# Patient Record
Sex: Female | Born: 1955 | Race: White | Hispanic: No | Marital: Married | Smoking: Former smoker
Health system: Southern US, Community
[De-identification: ages and names within clinical notes are randomized; demographics above are authoritative.]

## PROBLEM LIST (undated history)

## (undated) DIAGNOSIS — I10 Essential (primary) hypertension: Secondary | ICD-10-CM

## (undated) DIAGNOSIS — K5790 Diverticulosis of intestine, part unspecified, without perforation or abscess without bleeding: Secondary | ICD-10-CM

## (undated) DIAGNOSIS — E785 Hyperlipidemia, unspecified: Secondary | ICD-10-CM

## (undated) HISTORY — DX: Diverticulosis of intestine, part unspecified, without perforation or abscess without bleeding: K57.90

## (undated) HISTORY — DX: Hyperlipidemia, unspecified: E78.5

## (undated) HISTORY — PX: TUBAL LIGATION: SHX77

## (undated) HISTORY — DX: Essential (primary) hypertension: I10

---

## 1998-11-18 ENCOUNTER — Other Ambulatory Visit: Admission: RE | Admit: 1998-11-18 | Discharge: 1998-11-18 | Payer: Self-pay | Admitting: *Deleted

## 2000-03-04 ENCOUNTER — Other Ambulatory Visit: Admission: RE | Admit: 2000-03-04 | Discharge: 2000-03-04 | Payer: Self-pay | Admitting: *Deleted

## 2002-03-06 ENCOUNTER — Other Ambulatory Visit: Admission: RE | Admit: 2002-03-06 | Discharge: 2002-03-06 | Payer: Self-pay | Admitting: Obstetrics & Gynecology

## 2003-04-02 ENCOUNTER — Other Ambulatory Visit: Admission: RE | Admit: 2003-04-02 | Discharge: 2003-04-02 | Payer: Self-pay | Admitting: Obstetrics & Gynecology

## 2010-09-10 HISTORY — PX: ENDOMETRIAL ABLATION: SHX621

## 2011-02-03 ENCOUNTER — Encounter: Payer: Self-pay | Admitting: Internal Medicine

## 2011-02-16 ENCOUNTER — Encounter: Payer: Self-pay | Admitting: Internal Medicine

## 2011-02-16 ENCOUNTER — Ambulatory Visit (AMBULATORY_SURGERY_CENTER): Payer: PRIVATE HEALTH INSURANCE | Admitting: *Deleted

## 2011-02-16 VITALS — Ht 66.0 in | Wt 167.5 lb

## 2011-02-16 DIAGNOSIS — Z1211 Encounter for screening for malignant neoplasm of colon: Secondary | ICD-10-CM

## 2011-02-16 MED ORDER — PEG-KCL-NACL-NASULF-NA ASC-C 100 G PO SOLR: ORAL | Status: DC

## 2011-03-02 ENCOUNTER — Ambulatory Visit (AMBULATORY_SURGERY_CENTER): Payer: PRIVATE HEALTH INSURANCE | Admitting: Internal Medicine

## 2011-03-02 ENCOUNTER — Encounter: Payer: Self-pay | Admitting: Internal Medicine

## 2011-03-02 VITALS — BP 138/80 | HR 80 | Temp 97.7°F | Resp 12 | Ht 66.5 in | Wt 165.0 lb

## 2011-03-02 DIAGNOSIS — Z1211 Encounter for screening for malignant neoplasm of colon: Secondary | ICD-10-CM

## 2011-03-02 DIAGNOSIS — K5730 Diverticulosis of large intestine without perforation or abscess without bleeding: Secondary | ICD-10-CM

## 2011-03-02 DIAGNOSIS — K649 Unspecified hemorrhoids: Secondary | ICD-10-CM

## 2011-03-02 HISTORY — PX: COLONOSCOPY: SHX174

## 2011-03-02 MED ORDER — SODIUM CHLORIDE 0.9 % IV SOLN: 500.0000 mL | INTRAVENOUS | Status: DC

## 2011-03-02 NOTE — Patient Instructions (Addendum)
No polyps were found on today's exam. You have diverticulosis and hemorrhoids, which are common findings that usually do not lead to any problems. Please read the information below and the discharge instructions provided by the nurse. Your next routine colon cancer screening with colonoscopy should be in about 10 years, 2022. Iva Boop, MD, [facility]  Hemorrhoids Hemorrhoids are dilated (enlarged) veins around the rectum. Sometimes clots will form in the veins. This makes them swollen and painful. These are called thrombosed hemorrhoids. Causes of hemorrhoids include:  Pregnancy: this increases the pressure in the hemorrhoidal veins.   Constipation.   Straining to have a bowel movement.  HOME CARE INSTRUCTIONS  Eat a well balanced diet and drink 6 to 8 glasses of water every day to avoid constipation. You may also use a bulk laxative.   Avoid straining to have bowel movements.   Keep anal area dry and clean.   Only take over-the-counter or prescription medicines for pain, discomfort, or fever as directed by your caregiver.  If thrombosed:  Take hot sitz baths for 20 to 30 minutes, 3 to 4 times per day.   If the hemorrhoids are very tender and swollen, place ice packs on area as tolerated. Using ice packs between sitz baths may be helpful. Fill a plastic bag with ice and use a towel between the bag of ice and your skin.   Special creams and suppositories (Anusol, Nupercainal, Wyanoids) may be used or applied as directed.   Do not use a donut shaped pillow or sit on the toilet for long periods. This increases blood pooling and pain.   Move your bowels when your body has the urge; this will require less straining and will decrease pain and pressure.   Only take over-the-counter or prescription medicines for pain, discomfort, or fever as directed by your caregiver.  SEEK MEDICAL CARE IF:  You have increasing pain and swelling that is not controlled with your prescription.   You  have uncontrolled bleeding.   You have an inability or difficulty having a bowel movement.   You have pain or inflammation outside the area of the hemorrhoids.   You have chills and/or an oral temperature above 100.80F that lasts for 2 days or longer, or as your caregiver suggests.  MAKE SURE YOU:   Understand these instructions.   Will watch your condition.   Will get help right away if you are not doing well or get worse.  Document Released: 07/24/2000 Document Re-Released: 07/09/2008 [facility] Patient Information 2011 [place], [place].  Diverticulosis Diverticulosis is a common condition that develops when small pouches (diverticula) form in the wall of the colon. The risk of diverticulosis increases with age. It happens more often in people who eat a low-fiber diet. Most individuals with diverticulosis have no symptoms. Those individuals with symptoms usually experience belly (abdominal) pain, constipation, or loose stools (diarrhea). HOME CARE INSTRUCTIONS  Increase the amount of fiber in your diet as directed by your caregiver or dietician. This may reduce symptoms of diverticulosis.   Your caregiver may recommend taking a dietary fiber supplement.   Drink at least 6 to 8 glasses of water or other fluids each day to prevent constipation.   Try not to strain when you have a bowel movement.   Your caregiver may recommend avoiding nuts and seeds to prevent complications, although this is still an uncertain benefit.   Only take over-the-counter or prescription medicines for pain, discomfort, or fever as directed by your caregiver.  FOODS  HAVING HIGH FIBER CONTENT INCLUDE:  Fruits. Apple, peach, pear, tangerine, raisins, prunes.   Vegetables. Brussels sprouts, asparagus, broccoli, cabbage, carrot, cauliflower, romaine lettuce, spinach, summer squash, tomato, winter squash, zucchini.   Starchy Vegetables. Baked beans, kidney beans, lima beans, split peas, lentils, potatoes (with  skin).   Grains. Whole wheat bread, brown rice, bran flake cereal, plain oatmeal, white rice, shredded wheat, bran muffins.  SEEK IMMEDIATE MEDICAL CARE IF:  You develop increasing pain or severe bloating.   You have an oral temperature above 100.23F, not controlled by medicine.   You develop vomiting or bowel movements that are bloody or black.  Document Released: 04/23/2004 Document Re-Released: 01/14/2010 [facility] Patient Information 2011 [place], [place].   Please review all discharge papers given to you by the recovery room nurse.  If you have any problems after discharge please call [phone]. We will call you in the am to see how you are doing and answer any questions you may have. Thank you.

## 2011-03-03 ENCOUNTER — Telehealth: Payer: Self-pay | Admitting: *Deleted

## 2011-03-03 NOTE — Telephone Encounter (Signed)
No answer, no i.d. On machine

## 2015-07-06 ENCOUNTER — Emergency Department (HOSPITAL_COMMUNITY): Payer: No Typology Code available for payment source

## 2015-07-06 ENCOUNTER — Emergency Department (HOSPITAL_COMMUNITY)
Admission: EM | Admit: 2015-07-06 | Discharge: 2015-07-06 | Disposition: A | Discharge status: Home / Self Care | Payer: No Typology Code available for payment source | Attending: Emergency Medicine | Admitting: Emergency Medicine

## 2015-07-06 ENCOUNTER — Encounter (HOSPITAL_COMMUNITY): Payer: Self-pay | Admitting: *Deleted

## 2015-07-06 DIAGNOSIS — S61411A Laceration without foreign body of right hand, initial encounter: Secondary | ICD-10-CM | POA: Diagnosis not present

## 2015-07-06 DIAGNOSIS — S61216A Laceration without foreign body of right little finger without damage to nail, initial encounter: Secondary | ICD-10-CM | POA: Insufficient documentation

## 2015-07-06 DIAGNOSIS — W268XXA Contact with other sharp object(s), not elsewhere classified, initial encounter: Secondary | ICD-10-CM | POA: Insufficient documentation

## 2015-07-06 DIAGNOSIS — Z23 Encounter for immunization: Secondary | ICD-10-CM | POA: Diagnosis not present

## 2015-07-06 DIAGNOSIS — Y9289 Other specified places as the place of occurrence of the external cause: Secondary | ICD-10-CM | POA: Diagnosis not present

## 2015-07-06 DIAGNOSIS — Z87891 Personal history of nicotine dependence: Secondary | ICD-10-CM | POA: Diagnosis not present

## 2015-07-06 DIAGNOSIS — Y9389 Activity, other specified: Secondary | ICD-10-CM | POA: Insufficient documentation

## 2015-07-06 DIAGNOSIS — S6991XA Unspecified injury of right wrist, hand and finger(s), initial encounter: Secondary | ICD-10-CM | POA: Diagnosis present

## 2015-07-06 DIAGNOSIS — Y998 Other external cause status: Secondary | ICD-10-CM | POA: Insufficient documentation

## 2015-07-06 MED ORDER — TETANUS-DIPHTH-ACELL PERTUSSIS 5-2.5-18.5 LF-MCG/0.5 IM SUSP
0.5000 mL | Freq: Once | INTRAMUSCULAR | Status: AC
  Administered 2015-07-06: 0.5 mL via INTRAMUSCULAR
  Filled 2015-07-06: qty 0.5

## 2015-07-06 MED ORDER — HYDROCODONE-ACETAMINOPHEN 5-325 MG PO TABS: 1.0000 | ORAL_TABLET | Freq: Four times a day (QID) | ORAL | Status: DC | PRN

## 2015-07-06 MED ORDER — ONDANSETRON 4 MG PO TBDP
4.0000 mg | ORAL_TABLET | Freq: Once | ORAL | Status: AC
  Administered 2015-07-06: 4 mg via ORAL
  Filled 2015-07-06: qty 1

## 2015-07-06 MED ORDER — LIDOCAINE HCL 2 % IJ SOLN
10.0000 mL | Freq: Once | INTRAMUSCULAR | Status: AC
  Administered 2015-07-06: 200 mg via INTRADERMAL
  Filled 2015-07-06: qty 20

## 2015-07-06 MED ORDER — HYDROCODONE-ACETAMINOPHEN 5-325 MG PO TABS
1.0000 | ORAL_TABLET | Freq: Once | ORAL | Status: AC
  Administered 2015-07-06: 1 via ORAL
  Filled 2015-07-06: qty 1

## 2015-07-06 MED ORDER — LIDOCAINE HCL (PF) 1 % IJ SOLN: 20.0000 mL | Freq: Once | INTRAMUSCULAR | Status: DC

## 2015-07-06 NOTE — Discharge Instructions (Signed)

## 2015-07-06 NOTE — ED Provider Notes (Signed)
Dr. Rex Kras requested laceration repair of R hand.  LACERATION REPAIR Performed by: Domenic Moras Authorized byDomenic Moras Consent: Verbal consent obtained. Risks and benefits: risks, benefits and alternatives were discussed Consent given by: patient Patient identity confirmed: provided demographic data Prepped and Draped in normal sterile fashion Wound explored  Laceration Location: R hand; webspace between 1st-2nd fingers  Laceration Length: 8cm  No Foreign Bodies seen or palpated  Anesthesia: local infiltration  Local anesthetic: lidocaine 2% w/o epinephrine  Anesthetic total: 4 ml  Irrigation method: syringe Amount of cleaning: standard  Skin closure: prolene 5.0  Number of sutures: 14  Technique: sharp surgical debridement with sterile scissor, approximation of tissue, simple interrupted technique  Patient tolerance: Patient tolerated the procedure well with no immediate complications.  LACERATION REPAIR Performed by: Domenic Moras Authorized byDomenic Moras Consent: Verbal consent obtained. Risks and benefits: risks, benefits and alternatives were discussed Consent given by: patient Patient identity confirmed: provided demographic data Prepped and Draped in normal sterile fashion Wound explored  Laceration Location: R pinky finger: pad  Laceration Length: 1cm  No Foreign Bodies seen or palpated  Anesthesia: local infiltration  Local anesthetic: lidocaine 2% w/o epinephrine  Anesthetic total: 0.5 ml  Irrigation method: syringe Amount of cleaning: standard  Skin closure:   Number of sutures: prolene 5.0  Technique: simple interrupted  Patient tolerance: Patient tolerated the procedure well with no immediate complications.   Domenic Moras, PA-C 07/06/15 [place], MD 07/06/15 [phone]

## 2015-07-06 NOTE — ED Provider Notes (Signed)
CSN: [id number]     Arrival date & time 07/06/15  [id number] History   First MD Initiated Contact with Patient 07/06/15 0914     Chief Complaint  Patient presents with  . Extremity Laceration     (Consider location/radiation/quality/duration/timing/severity/associated sxs/prior Treatment) HPI Comments: 59 year old right-handed female who presents with right hand laceration. Just prior to arrival, the patient was working on a trailer when a spring that was fully extended snapped back and cut her right hand. She sustained a laceration near her thumb. She denies any finger numbness. She endorses moderate to severe, constant pain. No other injuries. Unknown last tetanus vaccination.  The history is provided by the patient.    History reviewed. No pertinent past medical history. Past Surgical History  Procedure Laterality Date  . Endometrial ablation  FEB. 2012  . Colonoscopy  03/02/11    diverticulosis, hemorrhoids   Family History  Problem Relation Age of Onset  . Colon cancer Neg Hx    Social History  Substance Use Topics  . Smoking status: Former Research scientist (life sciences)  . Smokeless tobacco: Never Used  . Alcohol Use: 1.2 oz/week    2 Cans of beer per week   OB History    No data available     Review of Systems  10 Systems reviewed and are negative for acute change except as noted in the HPI.   Allergies  Review of patient's allergies indicates no known allergies.  Home Medications   Prior to Admission medications   Medication Sig Start Date End Date Taking? Authorizing Provider  HYDROcodone-acetaminophen (NORCO/VICODIN) 5-325 MG tablet Take 1-2 tablets by mouth every 6 (six) hours as needed for severe pain. 07/06/15   Wenda Overland Little, MD   BP 138/92 mmHg  Pulse 82  Temp(Src) 98 F (36.7 C) (Oral)  Resp 14  SpO2 97% Physical Exam  Constitutional: She is oriented to person, place, and time. She appears well-developed and well-nourished.  Uncomfortable, holding right hand    HENT:  Head: Normocephalic and atraumatic.  Eyes: Conjunctivae are normal.  Pulmonary/Chest: Effort normal.  Musculoskeletal:  Laceration at base of R thumb involving web space and part of palmar surface; normal strength of thumb on extension, flexion, adduction, abduction; normal sensation x all 5 fingers; small laceration on pad of R 5th finger  Neurological: She is alert and oriented to person, place, and time.  Skin: Skin is warm.     4cm jagged laceration around base of R thumb  Psychiatric: She has a normal mood and affect. Judgment normal.  Nursing note and vitals reviewed.   ED Course  Procedures (including critical care time) Labs Review Labs Reviewed - No data to display  Imaging Review Dg Hand Complete Right  07/06/2015  CLINICAL DATA:  Laceration to RIGHT hand at base of RIGHT thumb and pulp of RIGHT fifth finger. 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 EXAM: RIGHT HAND - COMPLETE 3+ VIEW laceration RIGHT hand at base of thumb. COMPARISON:  None. FINDINGS: No evidence of fracture in the RIGHT hand. This soft tissue injury at the base of the thumb/thenar eminence. No foreign body. IMPRESSION: Soft tissue to the thenar eminence.  No fracture.  No foreign body. Electronically Signed   By: Suzy Bouchard M.D.   On: 07/06/2015 10:24      EKG Interpretation None     Medications  lidocaine (XYLOCAINE) 2 % (with pres) injection 200 mg (not administered)  lidocaine (  PF) (XYLOCAINE) 1 % injection 20 mL (not administered)  HYDROcodone-acetaminophen (NORCO/VICODIN) 5-325 MG per tablet 1 tablet (1 tablet Oral Given 07/06/15 1016)  ondansetron (ZOFRAN-ODT) disintegrating tablet 4 mg (4 mg Oral Given 07/06/15 1016)  Tdap (BOOSTRIX) injection 0.5 mL (0.5 mLs Intramuscular Given 07/06/15 1016)    MDM   Final diagnoses:  Hand laceration, right, initial encounter  Pt presents with right thumb laceration from a  spring that snapped back and cut her hand. On exam, pt neurovascularly intact with no active bleeding. She had good some opposition and normal flexion, extension, adduction and abduction strength. Plain films negative for fracture. No obvious exposure of tendon on examination of wound. Updated tetanus vaccination. Lac repaired at bedside by Domenic Moras, PA; see procedure note for details. Patient tolerated procedure well. Reviewed signs of infection and routine laceration care. Instructed to follow-up with hand surgeon in 2 weeks if any problems with mobility or strength. Patient voiced understanding and was discharged in satisfactory condition.   Sharlett Iles, MD 07/06/15 [phone]

## 2015-07-06 NOTE — ED Notes (Signed)
Pt sts she was working on a trailer when the cors snapped and cut her hand.

## 2015-07-25 ENCOUNTER — Ambulatory Visit
Admission: RE | Admit: 2015-07-25 | Discharge: 2015-07-25 | Disposition: A | Discharge status: Home / Self Care | Payer: No Typology Code available for payment source | Source: Ambulatory Visit | Attending: Family Medicine | Admitting: Family Medicine

## 2015-07-25 ENCOUNTER — Other Ambulatory Visit: Payer: Self-pay | Admitting: Family Medicine

## 2015-07-25 DIAGNOSIS — M7989 Other specified soft tissue disorders: Secondary | ICD-10-CM

## 2016-02-14 IMAGING — CR DG HAND COMPLETE 3+V*R*
3 series · 3 of 3 positions shown · non-contrast
Comparison: 07/06/2015.

CLINICAL DATA: Prior laceration.  Pain.  Initial evaluation.

EXAM:
RIGHT HAND - COMPLETE 3+ VIEW

[x hand pa right]
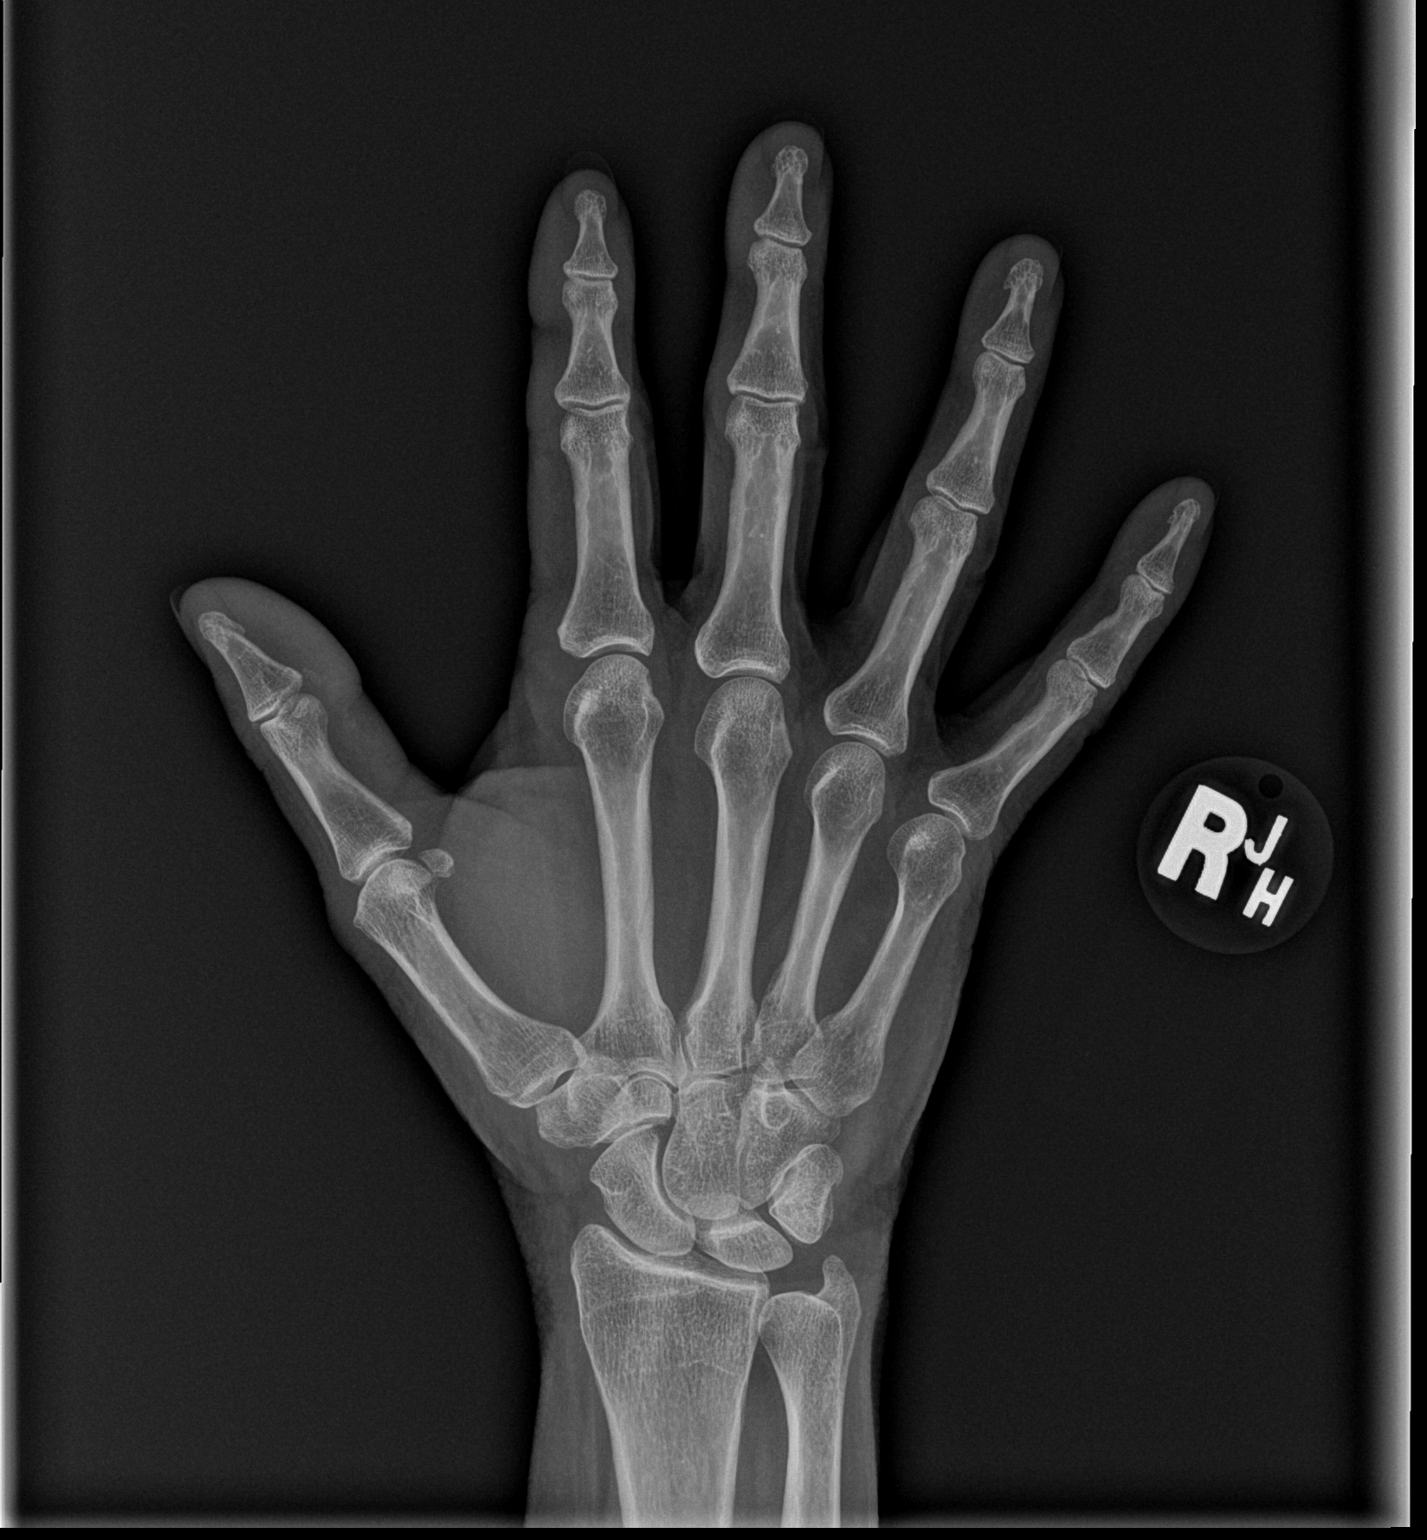

[x hand obl right]
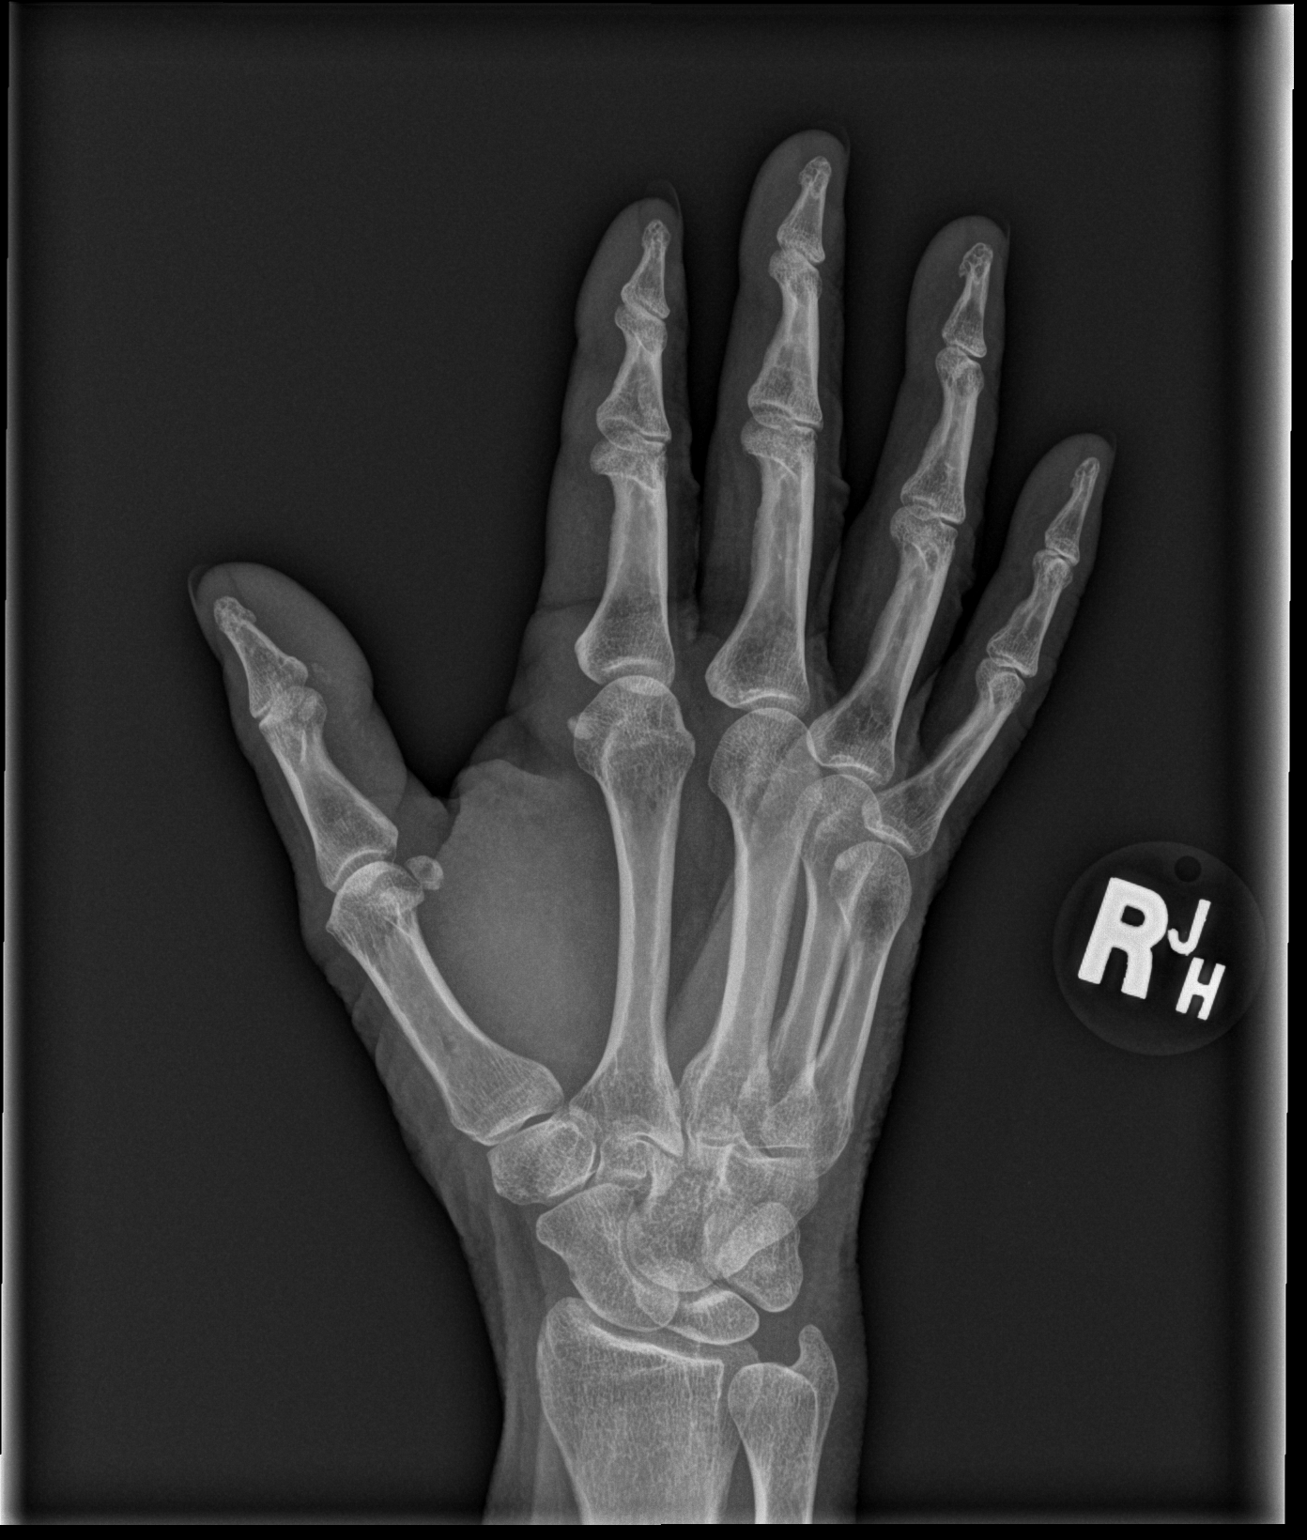

[x hand lat right]
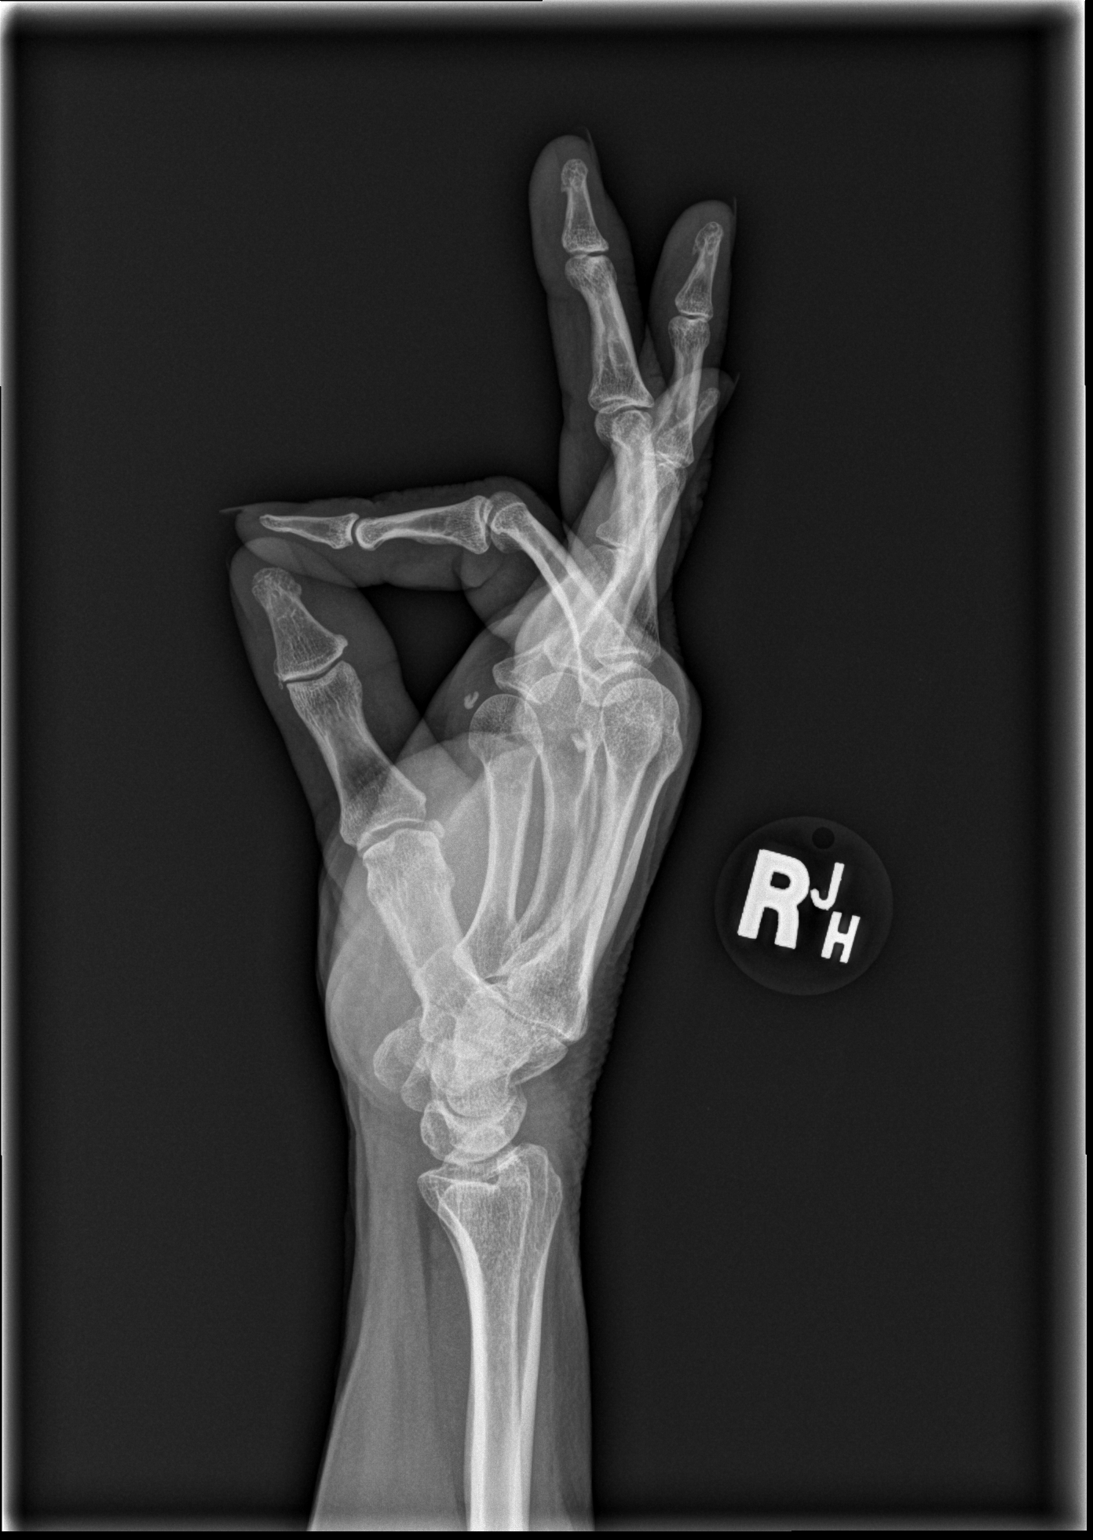

[3 of 3 positions shown; findings below may reference images not displayed]

FINDINGS: No acute bony or joint abnormality identified. No evidence of
fracture or dislocation. No foreign body noted.
IMPRESSION: No acute or focal abnormality.  No radiopaque foreign body.

## 2021-05-15 ENCOUNTER — Other Ambulatory Visit: Payer: Self-pay | Admitting: Family Medicine

## 2021-06-15 ENCOUNTER — Encounter: Payer: Self-pay | Admitting: Internal Medicine

## 2021-11-03 ENCOUNTER — Encounter: Payer: Self-pay | Admitting: Internal Medicine

## 2021-11-21 ENCOUNTER — Ambulatory Visit (AMBULATORY_SURGERY_CENTER): Payer: BC Managed Care – PPO | Admitting: *Deleted

## 2021-11-21 VITALS — Ht 66.0 in | Wt 175.0 lb

## 2021-11-21 DIAGNOSIS — Z1211 Encounter for screening for malignant neoplasm of colon: Secondary | ICD-10-CM

## 2021-11-21 NOTE — Progress Notes (Signed)
No egg or soy allergy known to patient  ?No issues known to pt with past sedation with any surgeries or procedures ?Patient denies ever being told they had issues or difficulty with intubation  ?No FH of Malignant Hyperthermia ?Pt is not on diet pills ?Pt is not on  home 02  ?Pt is not on blood thinners  ?Pt denies issues with constipation  ?No A fib or A flutter ? ?PV completed over the phone. Pt verified name, DOB, address and insurance during PV today.  ?Pt mailed instruction packet with copy of consent form to read and not return, and instructions.  ?Pt encouraged to call with questions or issues.  ? ? ?

## 2021-12-16 ENCOUNTER — Encounter: Payer: Self-pay | Admitting: Internal Medicine

## 2021-12-23 ENCOUNTER — Encounter: Payer: Self-pay | Admitting: Internal Medicine

## 2021-12-23 ENCOUNTER — Ambulatory Visit (AMBULATORY_SURGERY_CENTER): Payer: BC Managed Care – PPO | Admitting: Internal Medicine

## 2021-12-23 VITALS — BP 137/72 | HR 59 | Temp 97.7°F | Resp 15 | Ht 66.5 in | Wt 175.0 lb

## 2021-12-23 DIAGNOSIS — D128 Benign neoplasm of rectum: Secondary | ICD-10-CM | POA: Diagnosis not present

## 2021-12-23 DIAGNOSIS — D123 Benign neoplasm of transverse colon: Secondary | ICD-10-CM | POA: Diagnosis not present

## 2021-12-23 DIAGNOSIS — Z1211 Encounter for screening for malignant neoplasm of colon: Secondary | ICD-10-CM | POA: Diagnosis not present

## 2021-12-23 MED ORDER — SODIUM CHLORIDE 0.9 % IV SOLN: 500.0000 mL | Freq: Once | INTRAVENOUS | Status: DC

## 2021-12-23 NOTE — Op Note (Signed)
[place] ?Patient Name: Karen Cochran ?Procedure Date: 12/23/2021 9:18 AM ?MRN: [id number] ?Endoscopist: Gatha Mayer , MD ?Age: 66 ?Referring MD:  ?Date of Birth: 1956/05/11 ?Gender: Female ?Account #: [id number] ?Procedure:                Colonoscopy ?Indications:              Screening for colorectal malignant neoplasm, Last  ?                          colonoscopy: 2012 ?Medicines:                Monitored Anesthesia Care ?Procedure:                Pre-Anesthesia Assessment: ?                          - Prior to the procedure, a History and Physical  ?                          was performed, and patient medications and  ?                          allergies were reviewed. The patient's tolerance of  ?                          previous anesthesia was also reviewed. The risks  ?                          and benefits of the procedure and the sedation  ?                          options and risks were discussed with the patient.  ?                          All questions were answered, and informed consent  ?                          was obtained. Prior Anticoagulants: The patient has  ?                          taken no previous anticoagulant or antiplatelet  ?                          agents. ASA Grade Assessment: II - A patient with  ?                          mild systemic disease. After reviewing the risks  ?                          and benefits, the patient was deemed in  ?                          satisfactory condition to undergo the procedure. ?  After obtaining informed consent, the colonoscope  ?                          was passed under direct vision. Throughout the  ?                          procedure, the patient's blood pressure, pulse, and  ?                          oxygen saturations were monitored continuously. The  ?                          Olympus PCF-H190DL (#[id number]) Colonoscope was  ?                          introduced through the anus and advanced to the  the  ?                          cecum, identified by appendiceal orifice and  ?                          ileocecal valve. The colonoscopy was performed  ?                          without difficulty. The patient tolerated the  ?                          procedure well. The quality of the bowel  ?                          preparation was good. The ileocecal valve,  ?                          appendiceal orifice, and rectum were photographed.  ?                          The bowel preparation used was Miralax via split  ?                          dose instruction. ?Scope In: 9:24:45 AM ?Scope Out: 9:46:24 AM ?Scope Withdrawal Time: 0 hours 17 minutes 43 seconds  ?Total Procedure Duration: 0 hours 21 minutes 39 seconds  ?Findings:                 The perianal and digital rectal examinations were  ?                          normal. ?                          Two sessile polyps were found in the rectum and  ?                          distal transverse colon. The polyps were 3 to 4 mm  ?  in size. These polyps were removed with a cold  ?                          snare. Resection and retrieval were complete.  ?                          Verification of patient identification for the  ?                          specimen was done. Estimated blood loss was minimal. ?                          Two flat polyps were found in the proximal  ?                          transverse colon. The polyps were 1 to 2 mm in  ?                          size. These polyps were removed with a cold biopsy  ?                          forceps. Resection and retrieval were complete.  ?                          Verification of patient identification for the  ?                          specimen was done. Estimated blood loss was minimal. ?                          Multiple diverticula were found in the sigmoid  ?                          colon. ?                          The exam was otherwise without abnormality on  ?                           direct and retroflexion views. ?Complications:            No immediate complications. ?Estimated Blood Loss:     Estimated blood loss was minimal. ?Impression:               - Two 3 to 4 mm polyps in the rectum and in the  ?                          distal transverse colon, removed with a cold snare.  ?                          Resected and retrieved. ?                          - Two 1 to 2 mm polyps in the proximal  transverse  ?                          colon, removed with a cold biopsy forceps. Resected  ?                          and retrieved. ?                          - Diverticulosis in the sigmoid colon. ?                          - The examination was otherwise normal on direct  ?                          and retroflexion views. ?Recommendation:           - Patient has a contact number available for  ?                          emergencies. The signs and symptoms of potential  ?                          delayed complications were discussed with the  ?                          patient. Return to normal activities tomorrow.  ?                          Written discharge instructions were provided to the  ?                          patient. ?                          - Resume previous diet. ?                          - Continue present medications. ?                          - Repeat colonoscopy is recommended. The  ?                          colonoscopy date will be determined after pathology  ?                          results from today's exam become available for  ?                          review. ?Gatha Mayer, MD ?12/23/2021 9:52:31 AM ?This report has been signed electronically. ?

## 2021-12-23 NOTE — Progress Notes (Signed)
Pt's states no medical or surgical changes since previsit or office visit. 

## 2021-12-23 NOTE — Patient Instructions (Addendum)
I found and removed 4 tiny polyps - all look benign. ?You still have diverticulosis - thickened muscle rings and pouches in the colon wall. Please read the handout about this condition. ? ?I will let you know pathology results and when to have another routine colonoscopy by mail and/or My Chart. ? ?I appreciate the opportunity to care for you. ?Gatha Mayer, MD, [facility] ? ?YOU HAD AN ENDOSCOPIC PROCEDURE TODAY: Refer to the procedure report and other information in the discharge instructions given to you for any specific questions about what was found during the examination. If this information does not answer your questions, please call [place] office at [phone] to clarify.  ? ?YOU SHOULD EXPECT: Some feelings of bloating in the abdomen. Passage of more gas than usual. Walking can help get rid of the air that was put into your GI tract during the procedure and reduce the bloating. If you had a lower endoscopy (such as a colonoscopy or flexible sigmoidoscopy) you may notice spotting of blood in your stool or on the toilet paper. Some abdominal soreness may be present for a day or two, also. ? ?DIET: Your first meal following the procedure should be a light meal and then it is ok to progress to your normal diet. A half-sandwich or bowl of soup is an example of a good first meal. Heavy or fried foods are harder to digest and may make you feel nauseous or bloated. Drink plenty of fluids but you should avoid alcoholic beverages for 24 hours. If you had a esophageal dilation, please see attached instructions for diet.   ? ?ACTIVITY: Your care partner should take you home directly after the procedure. You should plan to take it easy, moving slowly for the rest of the day. You can resume normal activity the day after the procedure however YOU SHOULD NOT DRIVE, use power tools, machinery or perform tasks that involve climbing or major physical exertion for 24 hours (because of the sedation medicines used during the  test).  ? ?SYMPTOMS TO REPORT IMMEDIATELY: ?A gastroenterologist can be reached at any hour. Please call [phone]  for any of the following symptoms:  ?Following lower endoscopy (colonoscopy, flexible sigmoidoscopy) ?Excessive amounts of blood in the stool  ?Significant tenderness, worsening of abdominal pains  ?Swelling of the abdomen that is new, acute  ?Fever of 100? or higher  ? ?FOLLOW UP:  ?If any biopsies were taken you will be contacted by phone or by letter within the next 1-3 weeks. Call [phone]  if you have not heard about the biopsies in 3 weeks.  ?Please also call with any specific questions about appointments or follow up tests.  ? ?

## 2021-12-23 NOTE — Progress Notes (Signed)
[facility] History and Physical ? ? ?Primary Care Physician:  Jonathon Jordan, MD ? ? ?Reason for Procedure:   CRCA screening ? ?Plan:    colonoscopy ? ? ? ? ?HPI: Karen Cochran is a 66 y.o. female here for screening colonoscopy ? ? ?Past Medical History:  ?Diagnosis Date  ? Diverticulosis   ? Hyperlipidemia   ? BORDERLINE-- NO MEDS  ? Hypertension   ? BORDERLINE-- NO MEDS  ? ? ?Past Surgical History:  ?Procedure Laterality Date  ? COLONOSCOPY  03/02/2011  ? diverticulosis, hemorrhoids  ? ENDOMETRIAL ABLATION  09/10/2010  ? TUBAL LIGATION    ? ? ?Prior to Admission medications   ?Not on File  ? ? ?No current outpatient medications on file.  ? ?Current Facility-Administered Medications  ?Medication Dose Route Frequency Provider Last Rate Last Admin  ? 0.9 %  sodium chloride infusion  500 mL Intravenous Continuous Gatha Mayer, MD      ? 0.9 %  sodium chloride infusion  500 mL Intravenous Once Gatha Mayer, MD      ? ? ?Allergies as of 12/23/2021  ? (No Known Allergies)  ? ? ?Family History  ?Problem Relation Age of Onset  ? Colon cancer Neg Hx   ? Colon polyps Neg Hx   ? Esophageal cancer Neg Hx   ? Rectal cancer Neg Hx   ? Stomach cancer Neg Hx   ? ? ?Social History  ? ?Socioeconomic History  ? Marital status: Single  ?  Spouse name: Not on file  ? Number of children: Not on file  ? Years of education: Not on file  ? Highest education level: Not on file  ?Occupational History  ? Not on file  ?Tobacco Use  ? Smoking status: Former  ? Smokeless tobacco: Never  ?Substance and Sexual Activity  ? Alcohol use: Not Currently  ? Drug use: No  ? Sexual activity: Not on file  ?Other Topics Concern  ? Not on file  ?Social History Narrative  ? Not on file  ? ?Social Determinants of Health  ? ?Financial Resource Strain: Not on file  ?Food Insecurity: Not on file  ?Transportation Needs: Not on file  ?Physical Activity: Not on file  ?Stress: Not on file  ?Social Connections: Not on file  ?Intimate Partner  Violence: Not on file  ? ? ?Review of Systems: ? ?All other review of systems negative except as mentioned in the HPI. ? ?Physical Exam: ?Vital signs ?BP 127/72   Pulse (!) 57   Temp 97.7 ?F (36.5 ?C)   Ht 5' 6.5" (1.689 m)   Wt 175 lb (79.4 kg)   SpO2 100%   BMI 27.82 kg/m?  ? ?General:   Alert,  Well-developed, well-nourished, pleasant and cooperative in NAD ?Lungs:  Clear throughout to auscultation.   ?Heart:  Regular rate and rhythm; no murmurs, clicks, rubs,  or gallops. ?Abdomen:  Soft, nontender and nondistended. Normal bowel sounds.   ?Neuro/Psych:  Alert and cooperative. Normal mood and affect. A and O x 3 ? ? ?'@Naman Spychalski'$  Simonne Maffucci, MD, [facility] ?[facility] ?[phone] (pager) ?12/23/2021 9:17 AM@ ? ?

## 2021-12-23 NOTE — Progress Notes (Signed)
Called to room to assist during endoscopic procedure.  Patient ID and intended procedure confirmed with present staff. Received instructions for my participation in the procedure from the performing physician.  

## 2021-12-23 NOTE — Progress Notes (Signed)
PT taken to PACU. Monitors in place. VSS. Report given to RN. 

## 2021-12-25 ENCOUNTER — Telehealth: Payer: Self-pay

## 2021-12-25 NOTE — Telephone Encounter (Signed)
  Follow up Call-     12/23/2021    8:12 AM  Call back number  Post procedure Call Back phone  # [phone]  Permission to leave phone message Yes     Patient questions:  Do you have a fever, pain , or abdominal swelling? No. Pain Score  0 *  Have you tolerated food without any problems? Yes.    Have you been able to return to your normal activities? Yes.    Do you have any questions about your discharge instructions: Diet   No. Medications  No. Follow up visit  No.  Do you have questions or concerns about your Care? No.  Actions: * If pain score is 4 or above: No action needed, pain <4.

## 2022-01-01 ENCOUNTER — Encounter: Payer: Self-pay | Admitting: Internal Medicine

## 2022-01-01 DIAGNOSIS — Z860101 Personal history of adenomatous and serrated colon polyps: Secondary | ICD-10-CM

## 2022-01-01 DIAGNOSIS — Z86010 Personal history of colonic polyps: Secondary | ICD-10-CM

## 2022-01-01 HISTORY — DX: Personal history of adenomatous and serrated colon polyps: Z86.0101

## 2022-01-01 HISTORY — DX: Personal history of colonic polyps: Z86.010

## 2023-10-11 DIAGNOSIS — K080 Exfoliation of teeth due to systemic causes: Secondary | ICD-10-CM | POA: Diagnosis not present

## 2023-11-24 DIAGNOSIS — Z136 Encounter for screening for cardiovascular disorders: Secondary | ICD-10-CM | POA: Diagnosis not present

## 2023-11-24 DIAGNOSIS — Z0000 Encounter for general adult medical examination without abnormal findings: Secondary | ICD-10-CM | POA: Diagnosis not present

## 2023-11-24 DIAGNOSIS — I4891 Unspecified atrial fibrillation: Secondary | ICD-10-CM | POA: Diagnosis not present

## 2023-11-25 DIAGNOSIS — I4819 Other persistent atrial fibrillation: Secondary | ICD-10-CM | POA: Diagnosis not present

## 2023-11-30 ENCOUNTER — Other Ambulatory Visit: Payer: Self-pay | Admitting: Family Medicine

## 2023-11-30 DIAGNOSIS — Z13820 Encounter for screening for osteoporosis: Secondary | ICD-10-CM

## 2023-12-08 ENCOUNTER — Other Ambulatory Visit: Payer: Self-pay | Admitting: Family Medicine

## 2023-12-08 DIAGNOSIS — Z13820 Encounter for screening for osteoporosis: Secondary | ICD-10-CM

## 2023-12-09 DIAGNOSIS — R0683 Snoring: Secondary | ICD-10-CM | POA: Diagnosis not present

## 2023-12-15 DIAGNOSIS — I4891 Unspecified atrial fibrillation: Secondary | ICD-10-CM | POA: Diagnosis not present

## 2023-12-16 ENCOUNTER — Ambulatory Visit
Admission: RE | Admit: 2023-12-16 | Discharge: 2023-12-16 | Disposition: A | Discharge status: Home / Self Care | Source: Ambulatory Visit | Attending: Family Medicine | Admitting: Family Medicine

## 2023-12-16 DIAGNOSIS — N958 Other specified menopausal and perimenopausal disorders: Secondary | ICD-10-CM | POA: Diagnosis not present

## 2023-12-16 DIAGNOSIS — Z13820 Encounter for screening for osteoporosis: Secondary | ICD-10-CM

## 2023-12-16 DIAGNOSIS — M8588 Other specified disorders of bone density and structure, other site: Secondary | ICD-10-CM | POA: Diagnosis not present

## 2023-12-29 DIAGNOSIS — G4733 Obstructive sleep apnea (adult) (pediatric): Secondary | ICD-10-CM | POA: Diagnosis not present

## 2024-01-05 DIAGNOSIS — I480 Paroxysmal atrial fibrillation: Secondary | ICD-10-CM | POA: Diagnosis not present

## 2024-01-05 DIAGNOSIS — I10 Essential (primary) hypertension: Secondary | ICD-10-CM | POA: Diagnosis not present

## 2024-01-05 DIAGNOSIS — G4733 Obstructive sleep apnea (adult) (pediatric): Secondary | ICD-10-CM | POA: Diagnosis not present

## 2024-01-25 DIAGNOSIS — I480 Paroxysmal atrial fibrillation: Secondary | ICD-10-CM | POA: Diagnosis not present

## 2024-01-25 DIAGNOSIS — E785 Hyperlipidemia, unspecified: Secondary | ICD-10-CM | POA: Diagnosis not present

## 2024-01-25 DIAGNOSIS — G4733 Obstructive sleep apnea (adult) (pediatric): Secondary | ICD-10-CM | POA: Diagnosis not present

## 2024-01-30 NOTE — H&P (View-Only) (Signed)
 Electrophysiology Office Note:    Date:  01/31/2024   ID:  Karen Cochran, DOB 1955-12-30, MRN [id number]  PCP:  Chet Mad, DO    HeartCare Providers Cardiologist:  None     Referring MD: Espinoza, Alejandra, DO   History of Present Illness:    Karen Cochran is a 68 y.o. female with a medical history significant for dyslipidemia, referred for management of atrial fibrillation.     I reviewed referral notes by Dr. Athena.  Discussed the use of AI scribe software for clinical note transcription with the patient, who gave verbal consent to proceed.  History of Present Illness Karen Cochran is a 68 year old female with atrial fibrillation who presents for evaluation of her condition and treatment options. She was referred by Dr. Chet for evaluation of atrial fibrillation and RVR.  She was diagnosed with atrial fibrillation and rapid ventricular response (RVR) in April 2025 during a routine office visit. She was started on metoprolol 25 mg twice daily, which led to an improvement in her symptoms.  Eliquis was also started. An echocardiogram performed at that time showed an ejection fraction of 55% and a dilated left atrium.  She experiences symptoms of a fast heart rate, which she initially attributed to anxiety, especially after retiring in October of the previous year. She has noticed palpitations described as a 'flutter' sensation. Her energy levels have decreased, and she experiences fatigue, particularly during physical activities such as walking. She walks for 30 minutes in the mornings and longer on Sunday afternoons, but recently, she has found it difficult to walk up a hill at [facility], needing to stop and catch her breath.  Her husband has a history of atrial fibrillation and has undergone an ablation procedure.         Today, she reports that she feels well. She has increased fatigue in comparison to when she was last in known normal  rhythm.  EKGs/Labs/Other Studies Reviewed Today:     Echocardiogram:  TTE --[facility] 12/15/2023 LVEF 50-55% Severely dilated left atrium     EKG:   EKG Interpretation Date/Time:  Monday January 31 2024 09:51:21 EDT Ventricular Rate:  117 PR Interval:    QRS Duration:  82 QT Interval:  292 QTC Calculation: 407 R Axis:   44  Text Interpretation: Atrial fibrillation with rapid ventricular response Low voltage QRS Cannot rule out Anterior infarct , age undetermined No previous ECGs available Confirmed by Nancey Scotts [phone]) on 01/31/2024 10:15:12 AM     Physical Exam:    VS:  BP (!) 174/88   Pulse (!) 117   Ht 5' 6.5 (1.689 m)   Wt 172 lb (78 kg)   SpO2 97%   BMI 27.35 kg/m     Wt Readings from Last 3 Encounters:  01/31/24 172 lb (78 kg)  12/23/21 175 lb (79.4 kg)  11/21/21 175 lb (79.4 kg)     GEN: Well nourished, well developed in no acute distress CARDIAC: RRR, no murmurs, rubs, gallops RESPIRATORY:  Normal work of breathing MUSCULOSKELETAL:  edema    ASSESSMENT & PLAN:     Atrial fibrillation Diagnosed April 2025 Presented with RVR, still with rapid rates today despite metoprolol We discussed management options.  Given that she is symptomatic and rates are poorly controlled, I advised rhythm control.  I explained the ablation procedure and alternatives including Intraven drugs versus rate control.  Using a shared decision making approach, we decided to proceed with ablation.  We discussed  the indication, rationale, logistics, anticipated benefits, and potential risks of the ablation procedure including but not limited to -- bleed at the groin access site, chest pain, damage to nearby organs such as the diaphragm, lungs, or esophagus, need for a drainage tube, or prolonged hospitalization. I explained that the risk for stroke, heart attack, need for open chest surgery, or even death is very low but not zero. she  expressed understanding and wishes  to proceed.   Secondary hypercoagulable state Continue apixaban 5 mg twice daily  Dyslipidemia Continue rosuvastatin 10 mg      Signed, Yonathan Perrow E Rafan Sanders, MD  01/31/2024 10:22 AM    [facility]

## 2024-01-30 NOTE — Progress Notes (Signed)
 Electrophysiology Office Note:    Date:  01/31/2024   ID:  Karen Cochran, DOB 1955-12-30, MRN [id number]  PCP:  Chet Mad, DO    HeartCare Providers Cardiologist:  None     Referring MD: Espinoza, Alejandra, DO   History of Present Illness:    Karen Cochran is a 68 y.o. female with a medical history significant for dyslipidemia, referred for management of atrial fibrillation.     I reviewed referral notes by Dr. Athena.  Discussed the use of AI scribe software for clinical note transcription with the patient, who gave verbal consent to proceed.  History of Present Illness Karen Cochran is a 68 year old female with atrial fibrillation who presents for evaluation of her condition and treatment options. She was referred by Dr. Chet for evaluation of atrial fibrillation and RVR.  She was diagnosed with atrial fibrillation and rapid ventricular response (RVR) in April 2025 during a routine office visit. She was started on metoprolol 25 mg twice daily, which led to an improvement in her symptoms.  Eliquis was also started. An echocardiogram performed at that time showed an ejection fraction of 55% and a dilated left atrium.  She experiences symptoms of a fast heart rate, which she initially attributed to anxiety, especially after retiring in October of the previous year. She has noticed palpitations described as a 'flutter' sensation. Her energy levels have decreased, and she experiences fatigue, particularly during physical activities such as walking. She walks for 30 minutes in the mornings and longer on Sunday afternoons, but recently, she has found it difficult to walk up a hill at [facility], needing to stop and catch her breath.  Her husband has a history of atrial fibrillation and has undergone an ablation procedure.         Today, she reports that she feels well. She has increased fatigue in comparison to when she was last in known normal  rhythm.  EKGs/Labs/Other Studies Reviewed Today:     Echocardiogram:  TTE --[facility] 12/15/2023 LVEF 50-55% Severely dilated left atrium     EKG:   EKG Interpretation Date/Time:  Monday January 31 2024 09:51:21 EDT Ventricular Rate:  117 PR Interval:    QRS Duration:  82 QT Interval:  292 QTC Calculation: 407 R Axis:   44  Text Interpretation: Atrial fibrillation with rapid ventricular response Low voltage QRS Cannot rule out Anterior infarct , age undetermined No previous ECGs available Confirmed by Nancey Scotts [phone]) on 01/31/2024 10:15:12 AM     Physical Exam:    VS:  BP (!) 174/88   Pulse (!) 117   Ht 5' 6.5 (1.689 m)   Wt 172 lb (78 kg)   SpO2 97%   BMI 27.35 kg/m     Wt Readings from Last 3 Encounters:  01/31/24 172 lb (78 kg)  12/23/21 175 lb (79.4 kg)  11/21/21 175 lb (79.4 kg)     GEN: Well nourished, well developed in no acute distress CARDIAC: RRR, no murmurs, rubs, gallops RESPIRATORY:  Normal work of breathing MUSCULOSKELETAL:  edema    ASSESSMENT & PLAN:     Atrial fibrillation Diagnosed April 2025 Presented with RVR, still with rapid rates today despite metoprolol We discussed management options.  Given that she is symptomatic and rates are poorly controlled, I advised rhythm control.  I explained the ablation procedure and alternatives including Intraven drugs versus rate control.  Using a shared decision making approach, we decided to proceed with ablation.  We discussed  the indication, rationale, logistics, anticipated benefits, and potential risks of the ablation procedure including but not limited to -- bleed at the groin access site, chest pain, damage to nearby organs such as the diaphragm, lungs, or esophagus, need for a drainage tube, or prolonged hospitalization. I explained that the risk for stroke, heart attack, need for open chest surgery, or even death is very low but not zero. she  expressed understanding and wishes  to proceed.   Secondary hypercoagulable state Continue apixaban 5 mg twice daily  Dyslipidemia Continue rosuvastatin 10 mg      Signed, Yonathan Perrow E Rafan Sanders, MD  01/31/2024 10:22 AM    [facility]

## 2024-01-31 ENCOUNTER — Ambulatory Visit: Attending: Cardiovascular Disease | Admitting: Cardiovascular Disease

## 2024-01-31 ENCOUNTER — Other Ambulatory Visit: Payer: Self-pay | Admitting: Cardiovascular Disease

## 2024-01-31 VITALS — BP 174/88 | HR 117 | Ht 66.5 in | Wt 172.0 lb

## 2024-01-31 DIAGNOSIS — I4891 Unspecified atrial fibrillation: Secondary | ICD-10-CM

## 2024-01-31 DIAGNOSIS — I4819 Other persistent atrial fibrillation: Secondary | ICD-10-CM

## 2024-01-31 NOTE — Patient Instructions (Addendum)
 Medication Instructions:  Your physician recommends that you continue on your current medications as directed. Please refer to the Current Medication list given to you today. *If you need a refill on your cardiac medications before your next appointment, please call your pharmacy*  Lab Work: CBC and BMET - please have pre-procedure lab work completed on Monday, July 21. This can be done at ANY [facility] near you - no appointment required and this does not have to be fasting. If you have labs (blood work) drawn today and your tests are completely normal, you will receive your results only by: MyChart Message (if you have MyChart) OR A paper copy in the mail If you have any lab test that is abnormal or we need to change your treatment, we will call you to review the results.  Testing/Procedures: Cardioversion  Your physician has recommended that you have a Cardioversion (DCCV). Electrical Cardioversion uses a jolt of electricity to your heart either through paddles or wired patches attached to your chest. This is a controlled, usually prescheduled, procedure. Defibrillation is done under light anesthesia in the hospital, and you usually go home the day of the procedure. This is done to get your heart back into a normal rhythm. You are not awake for the procedure. Please see the instruction sheet given to you today.   Cardiac CT - someone will contact you to schedule this  Your physician has requested that you have cardiac CT. Cardiac computed tomography (CT) is a painless test that uses an x-ray machine to take clear, detailed pictures of your heart. For further information please visit [email or web address] Please follow instruction sheet as given.   Atrial Fibrillation Ablation - scheduled on Monday, August 18 We will be in contact closer to your ablation date with further instructions Your physician has recommended that you have an ablation. Catheter ablation is a medical procedure used to treat  some cardiac arrhythmias (irregular heartbeats). During catheter ablation, a long, thin, flexible tube is put into a blood vessel in your groin (upper thigh), or neck. This tube is called an ablation catheter. It is then guided to your heart through the blood vessel. Radio frequency waves destroy small areas of heart tissue where abnormal heartbeats may cause an arrhythmia to start. Please see the instruction sheet given to you today.  Follow-Up: At [facility], you and your health needs are our priority.  As part of our continuing mission to provide you with exceptional heart care, our providers are all part of one team.  This team includes your primary Cardiologist (physician) and Advanced Practice Providers or APPs (Physician Assistants and Nurse Practitioners) who all work together to provide you with the care you need, when you need it.  Your next appointment:   We will schedule follow up after your ablation  Provider:   Eulas Furbish, MD   Cardiac Ablation Cardiac ablation is a procedure to destroy, or ablate, a small amount of heart tissue that is causing problems. The heart has many electrical connections. Sometimes, these connections are abnormal and can cause the heart to beat very fast or irregularly. Ablating the abnormal areas can improve the heart's rhythm or return it to normal. Ablation may be done for people who: Have irregular or rapid heartbeats (arrhythmias). Have Wolff-Parkinson-White syndrome. Have taken medicines for an arrhythmia that did not work or caused side effects. Have a high-risk heartbeat that may be life-threatening. Tell a health care provider about: Any allergies you have. All medicines you are taking,  including vitamins, herbs, eye drops, creams, and over-the-counter medicines. Any problems you or family members have had with anesthesia. Any bleeding problems you have. Any surgeries you have had. Any medical conditions you have. Whether you  are pregnant or may be pregnant. What are the risks? Your health care provider will talk with you about risks. These may include: Infection. Bruising and bleeding. Stroke or blood clots. Damage to nearby structures or organs. Allergic reaction to medicines or dyes. Needing a pacemaker if the heart gets damaged. A pacemaker is a device that helps the heart beat normally. Failure of the procedure. A repeat procedure may be needed. What happens before the procedure? Medicines Ask your health care provider about: Changing or stopping your regular medicines. These include any heart rhythm medicines, diabetes medicines, or blood thinners you take. Taking medicines such as aspirin and ibuprofen. These medicines can thin your blood. Do not take them unless your health care provider tells you to. Taking over-the-counter medicines, vitamins, herbs, and supplements. General instructions Follow instructions from your health care provider about what you may eat and drink. If you will be going home right after the procedure, plan to have a responsible adult: Take you home from the hospital or clinic. You will not be allowed to drive. Care for you for the time you are told. Ask your health care provider what steps will be taken to prevent infection. What happens during the procedure?  An IV will be inserted into one of your veins. You may be given: A sedative. This helps you relax. Anesthesia. This will: Numb certain areas of your body. An incision will be made in your neck or your groin. A needle will be inserted through the incision and into a large vein in your neck or groin. The small, thin tube (catheter) will be inserted through the needle and moved to your heart. A type of X-ray (fluoroscopy) will be used to help guide the catheter and provide images of the heart on a monitor. Dye may be injected through the catheter to help your surgeon see the area of the heart that needs  treatment. Electrical currents will be sent from the catheter to destroy heart tissue in certain areas. There are three types of energy that may be used to do this: Heat (radiofrequency energy). Laser energy. Extreme cold (cryoablation). When the tissue has been destroyed, the catheter will be removed. Pressure will be held on the insertion area to prevent bleeding. A bandage (dressing) will be placed over the insertion area. The procedure may vary among health care providers and hospitals. What happens after the procedure? Your blood pressure, heart rate and rhythm, breathing rate, and blood oxygen level will be monitored until you leave the hospital or clinic. Your insertion area will be checked for bleeding. You will need to lie still for a few hours. If your groin was used, you will need to keep your leg straight for a few hours after the catheter is removed. This information is not intended to replace advice given to you by your health care provider. Make sure you discuss any questions you have with your health care provider. Document Revised: 01/13/2022 Document Reviewed: 01/13/2022 Elsevier Patient Education  2024 Elsevier Inc.       You are scheduled for a Cardioversion on Friday, June 27 with Dr. Kate.  Please arrive at the [facility] (Main Entrance A) at [facility]: [address] [place], [place] [address] at 7:00 AM (This time is 1.5 hour(s)  before your procedure to ensure your preparation).   Free valet parking service is available. You will check in at ADMITTING.   *Please Note: You will receive a call the day before your procedure to confirm the appointment time. That time may have changed from the original time based on the schedule for that day.*    DIET:  Nothing to eat or drink after midnight except a sip of water with medications (see medication instructions below)  Continue taking your anticoagulant (blood thinner): Apixaban (Eliquis).  You will need  to continue this after your procedure until you are told by your provider that it is safe to stop.    LABS: Your labs will be done at the hospital prior to your procedure - you will need to arrive 1 and 1/2 hours prior to your procedure.  FYI:  For your safety, and to allow us  to monitor your vital signs accurately during the surgery/procedure we request: If you have artificial nails, gel coating, SNS etc, please have those removed prior to your surgery/procedure. Not having the nail coverings /polish removed may result in cancellation or delay of your surgery/procedure.  Your support person will be asked to wait in the waiting room during your procedure.  It is OK to have someone drop you off and come back when you are ready to be discharged.  You cannot drive after the procedure and will need someone to drive you home.  Bring your insurance cards.  *Special Note: Every effort is made to have your procedure done on time. Occasionally there are emergencies that occur at the hospital that may cause delays. Please be patient if a delay does occur.

## 2024-02-03 NOTE — Progress Notes (Signed)
 Spoke to patient and instructed them to come at 07:00  and to be NPO after 0000. Medications reviewed.  Confirmed that patient will have a ride home and someone to stay with them for 24 hours after the procedure.

## 2024-02-04 ENCOUNTER — Encounter (HOSPITAL_COMMUNITY)
Admission: RE | Disposition: A | Discharge status: Home / Self Care | Payer: Self-pay | Source: Home / Self Care | Attending: Cardiology

## 2024-02-04 ENCOUNTER — Ambulatory Visit (HOSPITAL_COMMUNITY)
Admission: RE | Admit: 2024-02-04 | Discharge: 2024-02-04 | Disposition: A | Discharge status: Home / Self Care | Attending: Cardiology | Admitting: Cardiology

## 2024-02-04 ENCOUNTER — Ambulatory Visit (HOSPITAL_BASED_OUTPATIENT_CLINIC_OR_DEPARTMENT_OTHER): Admitting: Anesthesiology

## 2024-02-04 ENCOUNTER — Encounter (HOSPITAL_COMMUNITY): Payer: Self-pay | Admitting: Cardiology

## 2024-02-04 ENCOUNTER — Other Ambulatory Visit: Payer: Self-pay

## 2024-02-04 ENCOUNTER — Ambulatory Visit (HOSPITAL_COMMUNITY): Admitting: Anesthesiology

## 2024-02-04 DIAGNOSIS — E785 Hyperlipidemia, unspecified: Secondary | ICD-10-CM | POA: Insufficient documentation

## 2024-02-04 DIAGNOSIS — Z87891 Personal history of nicotine dependence: Secondary | ICD-10-CM | POA: Diagnosis not present

## 2024-02-04 DIAGNOSIS — I4891 Unspecified atrial fibrillation: Secondary | ICD-10-CM

## 2024-02-04 DIAGNOSIS — D6869 Other thrombophilia: Secondary | ICD-10-CM | POA: Diagnosis not present

## 2024-02-04 DIAGNOSIS — Z7901 Long term (current) use of anticoagulants: Secondary | ICD-10-CM | POA: Insufficient documentation

## 2024-02-04 DIAGNOSIS — I4819 Other persistent atrial fibrillation: Secondary | ICD-10-CM | POA: Diagnosis not present

## 2024-02-04 DIAGNOSIS — I10 Essential (primary) hypertension: Secondary | ICD-10-CM

## 2024-02-04 DIAGNOSIS — Z79899 Other long term (current) drug therapy: Secondary | ICD-10-CM | POA: Diagnosis not present

## 2024-02-04 HISTORY — PX: CARDIOVERSION: EP1203

## 2024-02-04 LAB — POCT I-STAT, CHEM 8
BUN: 12 mg/dL (ref 8–23)
Calcium, Ion: 1.26 mmol/L (ref 1.15–1.40)
Chloride: 105 mmol/L (ref 98–111)
Creatinine, Ser: 0.9 mg/dL (ref 0.44–1.00)
Glucose, Bld: 96 mg/dL (ref 70–99)
HCT: 42 % (ref 36.0–46.0)
Hemoglobin: 14.3 g/dL (ref 12.0–15.0)
Potassium: 4 mmol/L (ref 3.5–5.1)
Sodium: 143 mmol/L (ref 135–145)
TCO2: 26 mmol/L (ref 22–32)

## 2024-02-04 SURGERY — CARDIOVERSION (CATH LAB)
Anesthesia: General

## 2024-02-04 MED ORDER — PROPOFOL 10 MG/ML IV BOLUS
INTRAVENOUS | Status: DC | PRN
  Administered 2024-02-04: 70 mg via INTRAVENOUS

## 2024-02-04 MED ORDER — LIDOCAINE 2% (20 MG/ML) 5 ML SYRINGE
INTRAMUSCULAR | Status: DC | PRN
  Administered 2024-02-04: 80 mg via INTRAVENOUS

## 2024-02-04 MED ORDER — SODIUM CHLORIDE 0.9% FLUSH: 3.0000 mL | INTRAVENOUS | Status: DC | PRN

## 2024-02-04 MED ORDER — SODIUM CHLORIDE 0.9% FLUSH: 3.0000 mL | Freq: Two times a day (BID) | INTRAVENOUS | Status: DC

## 2024-02-04 SURGICAL SUPPLY — 1 items: PAD DEFIB RADIO PHYSIO CONN (PAD) ×1 IMPLANT

## 2024-02-04 NOTE — Anesthesia Preprocedure Evaluation (Signed)
 Anesthesia Evaluation  Patient identified by MRN, date of birth, ID band Patient awake    Reviewed: Allergy & Precautions, H&P , NPO status , Patient's Chart, lab work & pertinent test results  Airway Mallampati: II  TM Distance: >3 FB Neck ROM: Full    Dental no notable dental hx.    Pulmonary neg pulmonary ROS, former smoker   Pulmonary exam normal breath sounds clear to auscultation       Cardiovascular hypertension, Pt. on medications Normal cardiovascular exam+ dysrhythmias Atrial Fibrillation  Rhythm:Regular Rate:Normal     Neuro/Psych negative neurological ROS  negative psych ROS   GI/Hepatic negative GI ROS, Neg liver ROS,,,  Endo/Other  negative endocrine ROS    Renal/GU negative Renal ROS  negative genitourinary   Musculoskeletal negative musculoskeletal ROS (+)    Abdominal   Peds negative pediatric ROS (+)  Hematology negative hematology ROS (+)   Anesthesia Other Findings   Reproductive/Obstetrics negative OB ROS                             Anesthesia Physical Anesthesia Plan  ASA: 3  Anesthesia Plan: General   Post-op Pain Management: Minimal or no pain anticipated   Induction: Intravenous  PONV Risk Score and Plan: 3 and Propofol infusion and Treatment may vary due to age or medical condition  Airway Management Planned: Mask  Additional Equipment:   Intra-op Plan:   Post-operative Plan:   Informed Consent: I have reviewed the patients History and Physical, chart, labs and discussed the procedure including the risks, benefits and alternatives for the proposed anesthesia with the patient or authorized representative who has indicated his/her understanding and acceptance.     Dental advisory given  Plan Discussed with: CRNA  Anesthesia Plan Comments:        Anesthesia Quick Evaluation

## 2024-02-04 NOTE — Interval H&P Note (Signed)
 History and Physical Interval Note:  02/04/2024 8:16 AM  Karen Cochran  has presented today for surgery, with the diagnosis of AFIB.  The various methods of treatment have been discussed with the patient and family. After consideration of risks, benefits and other options for treatment, the patient has consented to  Procedure(s): CARDIOVERSION (N/A) as a surgical intervention.  The patient's history has been reviewed, patient examined, no change in status, stable for surgery.  I have reviewed the patient's chart and labs.  Questions were answered to the patient's satisfaction.     [name]

## 2024-02-04 NOTE — Anesthesia Postprocedure Evaluation (Signed)
 Anesthesia Post Note  Patient: Karen Cochran  Procedure(s) Performed: CARDIOVERSION     Anesthesia Type: General Anesthetic complications: no   There were no known notable events for this encounter.  Last Vitals:  Vitals:   02/04/24 0714  BP: (!) 152/103  Pulse: 89  Resp: (!) 23  Temp: 36.6 C  SpO2: 97%    Last Pain:  Vitals:   02/04/24 0732  TempSrc:   PainSc: 0-No pain                 Laketia Vicknair Odis Schmitz

## 2024-02-04 NOTE — Anesthesia Postprocedure Evaluation (Signed)
 Anesthesia Post Note  Patient: Karen Cochran  Procedure(s) Performed: CARDIOVERSION     Patient location during evaluation: PACU Anesthesia Type: General Level of consciousness: awake and alert Pain management: pain level controlled Vital Signs Assessment: post-procedure vital signs reviewed and stable Respiratory status: spontaneous breathing, nonlabored ventilation and respiratory function stable Cardiovascular status: blood pressure returned to baseline and stable Postop Assessment: no apparent nausea or vomiting Anesthetic complications: no   There were no known notable events for this encounter.  Last Vitals:  Vitals:   02/04/24 0845 02/04/24 0850  BP: (!) 143/85 (!) 131/95  Pulse: (!) 55 (!) 58  Resp: 18 18  Temp:    SpO2: 92% 96%    Last Pain:  Vitals:   02/04/24 0850  TempSrc:   PainSc: 0-No pain                 Butler Levander Pinal

## 2024-02-04 NOTE — Transfer of Care (Signed)
 Immediate Anesthesia Transfer of Care Note  Patient: Karen Cochran  Procedure(s) Performed: CARDIOVERSION  Patient Location: Cath Lab  Anesthesia Type:General  Level of Consciousness: drowsy  Airway & Oxygen Therapy: Patient Spontanous Breathing and Patient connected to nasal cannula oxygen  Post-op Assessment: Report given to RN and Post -op Vital signs reviewed and stable  Post vital signs: Reviewed and stable  Last Vitals:  Vitals Value Taken Time  BP    Temp    Pulse 77 02/04/24 08:11  Resp 25 02/04/24 08:11  SpO2 97 % 02/04/24 08:11  Vitals shown include unfiled device data.  Last Pain:  Vitals:   02/04/24 0732  TempSrc:   PainSc: 0-No pain         Complications: There were no known notable events for this encounter.

## 2024-02-04 NOTE — CV Procedure (Signed)
 Procedure:   DCCV  Indication:  Symptomatic atrial fibrillation  Procedure Note:  The patient signed informed consent.  They have had had therapeutic anticoagulation with Eliquis greater than 3 weeks.  Anesthesia was administered by Dr. Cleotilde.  Adequate airway was maintained throughout and vital followed per protocol.  They were cardioverted x 1 with 200J of biphasic synchronized energy.  They converted to NSR with rate 50s.  There were no apparent complications.  The patient had normal neuro status and respiratory status post procedure with vitals stable as recorded elsewhere.    Follow up:  They will continue on current medical therapy and follow up with cardiology as scheduled.  Lonni Nanas, MD 02/04/2024 8:31 AM

## 2024-02-07 DIAGNOSIS — Z1231 Encounter for screening mammogram for malignant neoplasm of breast: Secondary | ICD-10-CM | POA: Diagnosis not present

## 2024-02-23 DIAGNOSIS — L821 Other seborrheic keratosis: Secondary | ICD-10-CM | POA: Diagnosis not present

## 2024-02-23 DIAGNOSIS — D225 Melanocytic nevi of trunk: Secondary | ICD-10-CM | POA: Diagnosis not present

## 2024-02-23 DIAGNOSIS — L814 Other melanin hyperpigmentation: Secondary | ICD-10-CM | POA: Diagnosis not present

## 2024-02-23 DIAGNOSIS — D2271 Melanocytic nevi of right lower limb, including hip: Secondary | ICD-10-CM | POA: Diagnosis not present

## 2024-02-25 ENCOUNTER — Telehealth: Payer: Self-pay

## 2024-02-25 NOTE — Telephone Encounter (Signed)
 Called patient to confirm CT and Lab appointment.

## 2024-02-28 DIAGNOSIS — E785 Hyperlipidemia, unspecified: Secondary | ICD-10-CM | POA: Diagnosis not present

## 2024-02-29 ENCOUNTER — Telehealth: Payer: Self-pay

## 2024-02-29 NOTE — Telephone Encounter (Signed)
 CT/Ablation instruction letters mailed to pt.

## 2024-03-02 DIAGNOSIS — G4733 Obstructive sleep apnea (adult) (pediatric): Secondary | ICD-10-CM | POA: Diagnosis not present

## 2024-03-07 ENCOUNTER — Ambulatory Visit (HOSPITAL_COMMUNITY)
Admission: RE | Admit: 2024-03-07 | Discharge: 2024-03-07 | Disposition: A | Discharge status: Home / Self Care | Source: Ambulatory Visit | Attending: Cardiovascular Disease | Admitting: Cardiovascular Disease

## 2024-03-07 DIAGNOSIS — I517 Cardiomegaly: Secondary | ICD-10-CM | POA: Insufficient documentation

## 2024-03-07 DIAGNOSIS — I4891 Unspecified atrial fibrillation: Secondary | ICD-10-CM | POA: Diagnosis not present

## 2024-03-07 LAB — CBC
Hematocrit: 44.1 % (ref 34.0–46.6)
Hemoglobin: 14.2 g/dL (ref 11.1–15.9)
MCH: 30.3 pg (ref 26.6–33.0)
MCHC: 32.2 g/dL (ref 31.5–35.7)
MCV: 94 fL (ref 79–97)
Platelets: 180 x10E3/uL (ref 150–450)
RBC: 4.68 x10E6/uL (ref 3.77–5.28)
RDW: 13.1 % (ref 11.7–15.4)
WBC: 5.4 x10E3/uL (ref 3.4–10.8)

## 2024-03-07 MED ORDER — IOHEXOL 350 MG/ML SOLN
80.0000 mL | Freq: Once | INTRAVENOUS | Status: AC | PRN
  Administered 2024-03-07: 80 mL via INTRAVENOUS

## 2024-03-08 LAB — BASIC METABOLIC PANEL WITH GFR
BUN/Creatinine Ratio: 21 (ref 12–28)
BUN: 18 mg/dL (ref 8–27)
CO2: 22 mmol/L (ref 20–29)
Calcium: 9.4 mg/dL (ref 8.7–10.3)
Chloride: 102 mmol/L (ref 96–106)
Creatinine, Ser: 0.86 mg/dL (ref 0.57–1.00)
Glucose: 93 mg/dL (ref 70–99)
Potassium: 4.3 mmol/L (ref 3.5–5.2)
Sodium: 141 mmol/L (ref 134–144)
eGFR: 74 mL/min/1.73 (ref >59)

## 2024-03-09 ENCOUNTER — Ambulatory Visit: Payer: Self-pay

## 2024-03-09 DIAGNOSIS — K769 Liver disease, unspecified: Secondary | ICD-10-CM

## 2024-03-16 NOTE — Telephone Encounter (Signed)
 Patient is returning call.

## 2024-03-19 ENCOUNTER — Encounter (HOSPITAL_COMMUNITY): Payer: Self-pay

## 2024-03-20 ENCOUNTER — Telehealth (HOSPITAL_COMMUNITY): Payer: Self-pay

## 2024-03-20 DIAGNOSIS — G4733 Obstructive sleep apnea (adult) (pediatric): Secondary | ICD-10-CM | POA: Diagnosis not present

## 2024-03-20 NOTE — Telephone Encounter (Signed)
 Spoke with patient to discuss upcoming procedure.   CT: completed.  Labs: completed.   Any recent signs of acute illness or been started on antibiotics? No Any new medications started? No Any medications to hold? No Any missed doses of blood thinner?  No Advised patient to continue taking ANTICOAGULANT: Eliquis (Apixaban) twice daily without missing any doses.  Medication instructions:  On the morning of your procedure DO NOT take any medication., including Eliquis or the procedure may be rescheduled. Nothing to eat or drink after midnight prior to your procedure.  Confirmed patient is scheduled for Atrial Fibrillation Ablation on Monday, August 18 with Dr. Eulas Furbish. Instructed patient to arrive at the Main Entrance A at [facility]: [address] [place], [place] [address] and check in at Admitting at 8:00 AM.  Advised of plan to go home the same day and will only stay overnight if medically necessary. You MUST have a responsible adult to drive you home and MUST be with you the first 24 hours after you arrive home or your procedure could be cancelled.  Patient verbalized understanding to all instructions provided and agreed to proceed with procedure.

## 2024-03-23 ENCOUNTER — Other Ambulatory Visit: Payer: Self-pay | Admitting: Cardiovascular Disease

## 2024-03-23 ENCOUNTER — Ambulatory Visit (HOSPITAL_COMMUNITY)
Admission: RE | Admit: 2024-03-23 | Discharge: 2024-03-23 | Disposition: A | Discharge status: Home / Self Care | Source: Ambulatory Visit | Attending: Cardiovascular Disease | Admitting: Cardiovascular Disease

## 2024-03-23 DIAGNOSIS — K8020 Calculus of gallbladder without cholecystitis without obstruction: Secondary | ICD-10-CM | POA: Diagnosis not present

## 2024-03-23 DIAGNOSIS — K5730 Diverticulosis of large intestine without perforation or abscess without bleeding: Secondary | ICD-10-CM | POA: Diagnosis not present

## 2024-03-23 DIAGNOSIS — R109 Unspecified abdominal pain: Secondary | ICD-10-CM | POA: Diagnosis not present

## 2024-03-23 DIAGNOSIS — K7581 Nonalcoholic steatohepatitis (NASH): Secondary | ICD-10-CM | POA: Diagnosis not present

## 2024-03-23 DIAGNOSIS — K769 Liver disease, unspecified: Secondary | ICD-10-CM | POA: Diagnosis not present

## 2024-03-23 MED ORDER — GADOBUTROL 1 MMOL/ML IV SOLN
8.0000 mL | Freq: Once | INTRAVENOUS | Status: AC | PRN
  Administered 2024-03-23: 8 mL via INTRAVENOUS

## 2024-03-26 NOTE — Anesthesia Preprocedure Evaluation (Signed)
 Anesthesia Evaluation  Patient identified by MRN, date of birth, ID band Patient awake    Reviewed: Allergy & Precautions, H&P , NPO status , Patient's Chart, lab work & pertinent test results, reviewed documented beta blocker date and time   History of Anesthesia Complications Negative for: history of anesthetic complications  Airway Mallampati: II  TM Distance: >3 FB Neck ROM: Full    Dental no notable dental hx. (+) Teeth Intact, Dental Advisory Given   Pulmonary sleep apnea , former smoker   Pulmonary exam normal breath sounds clear to auscultation       Cardiovascular hypertension, Pt. on medications and Pt. on home beta blockers Normal cardiovascular exam+ dysrhythmias Atrial Fibrillation  Rhythm:Irregular Rate:Abnormal     Neuro/Psych negative neurological ROS  negative psych ROS   GI/Hepatic negative GI ROS, Neg liver ROS,,,  Endo/Other  negative endocrine ROS    Renal/GU negative Renal ROSLab Results      Component                Value               Date                                  K                        4.3                 03/07/2024                CO2                      22                  03/07/2024                BUN                      18                  03/07/2024                CREATININE               0.86                03/07/2024                EGFR                     74                  03/07/2024                  negative genitourinary   Musculoskeletal negative musculoskeletal ROS (+)    Abdominal   Peds negative pediatric ROS (+)  Hematology negative hematology ROS (+) Eliquis  Lab Results      Component                Value               Date                      WBC  5.4                 03/07/2024                HGB                      14.2                03/07/2024                HCT                      44.1                03/07/2024                 MCV                      94                  03/07/2024                PLT                      180                 03/07/2024              Anesthesia Other Findings nkda  Reproductive/Obstetrics negative OB ROS                              Anesthesia Physical Anesthesia Plan  ASA: 3  Anesthesia Plan: General   Post-op Pain Management: Minimal or no pain anticipated   Induction: Intravenous  PONV Risk Score and Plan: 3 and Ondansetron  and Treatment may vary due to age or medical condition  Airway Management Planned: Oral ETT  Additional Equipment: None  Intra-op Plan:   Post-operative Plan: Extubation in OR  Informed Consent: I have reviewed the patients History and Physical, chart, labs and discussed the procedure including the risks, benefits and alternatives for the proposed anesthesia with the patient or authorized representative who has indicated his/her understanding and acceptance.     Dental advisory given  Plan Discussed with: CRNA and Surgeon  Anesthesia Plan Comments:         Anesthesia Quick Evaluation

## 2024-03-26 NOTE — Pre-Procedure Instructions (Signed)
 Instructed patient on the following items: Arrival time 0800 Nothing to eat or drink after midnight No meds AM of procedure Responsible person to drive you home and stay with you for 24 hrs  Have you missed any doses of anti-coagulant Eliquis- takes twice a day, hasn't missed any doses in last 4 weeks.  Don't take dose morning of procedure.

## 2024-03-27 ENCOUNTER — Other Ambulatory Visit: Payer: Self-pay

## 2024-03-27 ENCOUNTER — Encounter (HOSPITAL_COMMUNITY)
Admission: RE | Disposition: A | Discharge status: Home / Self Care | Payer: Self-pay | Source: Home / Self Care | Attending: Cardiovascular Disease

## 2024-03-27 ENCOUNTER — Ambulatory Visit (HOSPITAL_COMMUNITY)
Admission: RE | Admit: 2024-03-27 | Discharge: 2024-03-27 | Disposition: A | Discharge status: Home / Self Care | Attending: Cardiovascular Disease | Admitting: Cardiovascular Disease

## 2024-03-27 ENCOUNTER — Ambulatory Visit (HOSPITAL_COMMUNITY): Payer: Self-pay | Admitting: Anesthesiology

## 2024-03-27 DIAGNOSIS — I10 Essential (primary) hypertension: Secondary | ICD-10-CM

## 2024-03-27 DIAGNOSIS — E785 Hyperlipidemia, unspecified: Secondary | ICD-10-CM | POA: Insufficient documentation

## 2024-03-27 DIAGNOSIS — G4730 Sleep apnea, unspecified: Secondary | ICD-10-CM | POA: Diagnosis not present

## 2024-03-27 DIAGNOSIS — Z7901 Long term (current) use of anticoagulants: Secondary | ICD-10-CM | POA: Insufficient documentation

## 2024-03-27 DIAGNOSIS — D6869 Other thrombophilia: Secondary | ICD-10-CM | POA: Insufficient documentation

## 2024-03-27 DIAGNOSIS — Z79899 Other long term (current) drug therapy: Secondary | ICD-10-CM | POA: Diagnosis not present

## 2024-03-27 DIAGNOSIS — I4819 Other persistent atrial fibrillation: Secondary | ICD-10-CM

## 2024-03-27 DIAGNOSIS — K769 Liver disease, unspecified: Secondary | ICD-10-CM

## 2024-03-27 DIAGNOSIS — Z87891 Personal history of nicotine dependence: Secondary | ICD-10-CM | POA: Diagnosis not present

## 2024-03-27 HISTORY — PX: ATRIAL FIBRILLATION ABLATION: EP1191

## 2024-03-27 LAB — POCT ACTIVATED CLOTTING TIME: Activated Clotting Time: 412 s

## 2024-03-27 SURGERY — ATRIAL FIBRILLATION ABLATION
Anesthesia: General

## 2024-03-27 MED ORDER — ONDANSETRON HCL 4 MG/2ML IJ SOLN: 4.0000 mg | Freq: Four times a day (QID) | INTRAMUSCULAR | Status: DC | PRN

## 2024-03-27 MED ORDER — ROCURONIUM BROMIDE 10 MG/ML (PF) SYRINGE
PREFILLED_SYRINGE | INTRAVENOUS | Status: DC | PRN
  Administered 2024-03-27: 50 mg via INTRAVENOUS

## 2024-03-27 MED ORDER — SUGAMMADEX SODIUM 200 MG/2ML IV SOLN
INTRAVENOUS | Status: DC | PRN
  Administered 2024-03-27: 160 mg via INTRAVENOUS

## 2024-03-27 MED ORDER — LIDOCAINE 2% (20 MG/ML) 5 ML SYRINGE
INTRAMUSCULAR | Status: DC | PRN
  Administered 2024-03-27: 60 mg via INTRAVENOUS

## 2024-03-27 MED ORDER — HEPARIN SODIUM (PORCINE) 1000 UNIT/ML IJ SOLN
INTRAMUSCULAR | Status: DC | PRN
Start: 2024-03-27 — End: 2024-03-27
  Administered 2024-03-27: 12000 [IU] via INTRAVENOUS

## 2024-03-27 MED ORDER — DEXAMETHASONE SODIUM PHOSPHATE 10 MG/ML IJ SOLN
INTRAMUSCULAR | Status: DC | PRN
  Administered 2024-03-27: 10 mg via INTRAVENOUS

## 2024-03-27 MED ORDER — HEPARIN SODIUM (PORCINE) 1000 UNIT/ML IJ SOLN
INTRAMUSCULAR | Status: AC
  Filled 2024-03-27: qty 10

## 2024-03-27 MED ORDER — PROTAMINE SULFATE 10 MG/ML IV SOLN
INTRAVENOUS | Status: DC | PRN
  Administered 2024-03-27 (×2): 25 mg via INTRAVENOUS

## 2024-03-27 MED ORDER — MIDAZOLAM HCL 2 MG/2ML IJ SOLN
INTRAMUSCULAR | Status: DC | PRN
  Administered 2024-03-27: 2 mg via INTRAVENOUS

## 2024-03-27 MED ORDER — ATROPINE SULFATE 1 MG/10ML IJ SOSY
PREFILLED_SYRINGE | INTRAMUSCULAR | Status: AC
  Filled 2024-03-27: qty 10

## 2024-03-27 MED ORDER — HEPARIN (PORCINE) IN NACL 1000-0.9 UT/500ML-% IV SOLN
INTRAVENOUS | Status: DC | PRN
  Administered 2024-03-27 (×3): 500 mL

## 2024-03-27 MED ORDER — MIDAZOLAM HCL 2 MG/2ML IJ SOLN
INTRAMUSCULAR | Status: AC
  Filled 2024-03-27: qty 2

## 2024-03-27 MED ORDER — FENTANYL CITRATE (PF) 100 MCG/2ML IJ SOLN
INTRAMUSCULAR | Status: AC
Start: 2024-03-27 — End: 2024-03-27
  Filled 2024-03-27: qty 2

## 2024-03-27 MED ORDER — FENTANYL CITRATE (PF) 250 MCG/5ML IJ SOLN
INTRAMUSCULAR | Status: DC | PRN
  Administered 2024-03-27 (×2): 50 ug via INTRAVENOUS

## 2024-03-27 MED ORDER — ATROPINE SULFATE 1 MG/ML IV SOLN
INTRAVENOUS | Status: DC | PRN
Start: 2024-03-27 — End: 2024-03-27
  Administered 2024-03-27: 1 mg via INTRAVENOUS

## 2024-03-27 MED ORDER — PHENYLEPHRINE 80 MCG/ML (10ML) SYRINGE FOR IV PUSH (FOR BLOOD PRESSURE SUPPORT)
PREFILLED_SYRINGE | INTRAVENOUS | Status: DC | PRN
  Administered 2024-03-27 (×2): 80 ug via INTRAVENOUS

## 2024-03-27 MED ORDER — SODIUM CHLORIDE 0.9 % IV SOLN: INTRAVENOUS | Status: DC

## 2024-03-27 MED ORDER — PHENYLEPHRINE HCL-NACL 20-0.9 MG/250ML-% IV SOLN
INTRAVENOUS | Status: DC | PRN
  Administered 2024-03-27: 30 ug/min via INTRAVENOUS

## 2024-03-27 MED ORDER — ONDANSETRON HCL 4 MG/2ML IJ SOLN
INTRAMUSCULAR | Status: DC | PRN
  Administered 2024-03-27: 4 mg via INTRAVENOUS

## 2024-03-27 MED ORDER — PROPOFOL 10 MG/ML IV BOLUS
INTRAVENOUS | Status: DC | PRN
  Administered 2024-03-27: 150 mg via INTRAVENOUS

## 2024-03-27 SURGICAL SUPPLY — 19 items
BAG SNAP BAND KOVER 36X36 (MISCELLANEOUS) IMPLANT
CABLE FARASTAR GEN2 SNGL USE (CABLE) IMPLANT
CATH FARAWAVE 2.0 31 (CATHETERS) IMPLANT
CATH GE 8FR SOUNDSTAR (CATHETERS) IMPLANT
CATH OCTARAY 2.0 F 3-3-3-3-3 (CATHETERS) IMPLANT
CATH WEBSTER BI DIR CS D-F CRV (CATHETERS) IMPLANT
CLOSURE PERCLOSE PROSTYLE (VASCULAR PRODUCTS) IMPLANT
COVER SWIFTLINK CONNECTOR (BAG) ×1 IMPLANT
DEVICE CLOSURE MYNXGRIP 6/7F (Vascular Products) IMPLANT
DILATOR VESSEL 38 20CM 16FR (INTRODUCER) IMPLANT
GUIDEWIRE INQWIRE 1.5J.035X260 (WIRE) IMPLANT
KIT VERSACROSS CNCT FARADRIVE (KITS) IMPLANT
PACK EP LF (CUSTOM PROCEDURE TRAY) ×1 IMPLANT
PAD DEFIB RADIO PHYSIO CONN (PAD) ×1 IMPLANT
PATCH CARTO3 (PAD) IMPLANT
SHEATH FARADRIVE STEERABLE (SHEATH) IMPLANT
SHEATH PINNACLE 8F 10CM (SHEATH) IMPLANT
SHEATH PINNACLE 9F 10CM (SHEATH) IMPLANT
SHEATH PROBE COVER 6X72 (BAG) IMPLANT

## 2024-03-27 NOTE — Transfer of Care (Signed)
 Immediate Anesthesia Transfer of Care Note  Patient: Karen Cochran  Procedure(s) Performed: ATRIAL FIBRILLATION ABLATION  Patient Location: PACU and Cath Lab  Anesthesia Type:General  Level of Consciousness: drowsy and patient cooperative  Airway & Oxygen Therapy: Patient Spontanous Breathing and Patient connected to face mask oxygen  Post-op Assessment: Report given to RN and Post -op Vital signs reviewed and stable  Post vital signs: Reviewed and stable  Last Vitals:  Vitals Value Taken Time  BP    Temp 36.1 C 03/27/24 11:27  Pulse 89 03/27/24 11:30  Resp 21 03/27/24 11:30  SpO2 96 % 03/27/24 11:30  Vitals shown include unfiled device data.  Last Pain:  Vitals:   03/27/24 1127  TempSrc: Temporal  PainSc: Asleep         Complications: There were no known notable events for this encounter.

## 2024-03-27 NOTE — Anesthesia Postprocedure Evaluation (Signed)
 Anesthesia Post Note  Patient: Karen Cochran  Procedure(s) Performed: ATRIAL FIBRILLATION ABLATION     Patient location during evaluation: PACU Anesthesia Type: General Level of consciousness: awake and alert, oriented and patient cooperative Pain management: pain level controlled Vital Signs Assessment: post-procedure vital signs reviewed and stable Respiratory status: spontaneous breathing, nonlabored ventilation and respiratory function stable Cardiovascular status: blood pressure returned to baseline and stable Postop Assessment: no apparent nausea or vomiting Anesthetic complications: no   There were no known notable events for this encounter.  Last Vitals:  Vitals:   03/27/24 1150 03/27/24 1157  BP: 134/89   Pulse: 88   Resp: 14   Temp:  (!) 36.2 C  SpO2: 94%     Last Pain:  Vitals:   03/27/24 1157  TempSrc: Temporal  PainSc: 0-No pain   Pain Goal:                   [name]

## 2024-03-27 NOTE — Telephone Encounter (Signed)
 Per Dr. Nancey, patient will need to be referred to GI/Hepatology based on MRI results. Can you please assist with getting this arranged?

## 2024-03-27 NOTE — Anesthesia Procedure Notes (Signed)
 Procedure Name: Intubation Date/Time: 03/27/2024 10:01 AM  Performed by: Genny Gun, CRNAPre-anesthesia Checklist: Patient identified, Emergency Drugs available, Suction available, Patient being monitored and Timeout performed Patient Re-evaluated:Patient Re-evaluated prior to induction Oxygen Delivery Method: Circle system utilized Preoxygenation: Pre-oxygenation with 100% oxygen Induction Type: IV induction Ventilation: Mask ventilation without difficulty and Oral airway inserted - appropriate to patient size Grade View: Grade I Tube type: Oral Tube size: 7.0 mm Number of attempts: 1 Placement Confirmation: ETT inserted through vocal cords under direct vision, positive ETCO2 and breath sounds checked- equal and bilateral Secured at: 22 cm Tube secured with: Tape Dental Injury: Teeth and Oropharynx as per pre-operative assessment

## 2024-03-27 NOTE — H&P (Signed)
 Electrophysiology Office Note:    Date:  03/27/2024   ID:  Karen Cochran, DOB December 01, 1955, MRN [id number]  PCP:  Chet Mad, DO   [facility] Providers Cardiologist:  None Electrophysiologist:  Eulas FORBES Furbish, MD     Referring MD: No ref. provider found   History of Present Illness:    Karen Cochran is a 68 y.o. female with a medical history significant for dyslipidemia, referred for management of atrial fibrillation.     I reviewed referral notes by Dr. Athena.  Discussed the use of AI scribe software for clinical note transcription with the patient, who gave verbal consent to proceed.  History of Present Illness Karen Cochran is a 68 year old female with atrial fibrillation who presents for evaluation of her condition and treatment options. She was referred by Dr. Chet for evaluation of atrial fibrillation and RVR.  She was diagnosed with atrial fibrillation and rapid ventricular response (RVR) in April 2025 during a routine office visit. She was started on metoprolol 25 mg twice daily, which led to an improvement in her symptoms.  Eliquis was also started. An echocardiogram performed at that time showed an ejection fraction of 55% and a dilated left atrium.  She experiences symptoms of a fast heart rate, which she initially attributed to anxiety, especially after retiring in October of the previous year. She has noticed palpitations described as a 'flutter' sensation. Her energy levels have decreased, and she experiences fatigue, particularly during physical activities such as walking. She walks for 30 minutes in the mornings and longer on Sunday afternoons, but recently, she has found it difficult to walk up a hill at [facility] [facility], needing to stop and catch her breath.  Her husband has a history of atrial fibrillation and has undergone an ablation procedure.         Today, she reports that she feels well. She has increased fatigue in comparison to when  she was last in known normal rhythm.  I reviewed the patient's CT and labs. There was no LAA thrombus. she  has not missed any doses of anticoagulation, and she took her dose last night. There have been no changes in the patient's diagnoses, medications, or condition since our recent clinic visit.   Note: her CT showed an abnormal finding, a liver mass. MRI was consistent with hepatic adenoma. The patient does not have a history of liver disease or malignancy. She used birth control pills in the distant past but no HRT. I consulted with Dr. Victory, a hepatologist at [facility]. Based on his guidance, I will refer the patient to GI, and we will proceed with ablation today.  EKGs/Labs/Other Studies Reviewed Today:     Echocardiogram:  TTE --[facility] 12/15/2023 LVEF 50-55% Severely dilated left atrium     EKG:         Physical Exam:    VS:  BP (!) 156/97   Pulse (!) 107   Temp 97.9 F (36.6 C) (Oral)   Resp 18   Ht 5' 6.5 (1.689 m)   Wt 77.1 kg   SpO2 97%   BMI 27.03 kg/m     Wt Readings from Last 3 Encounters:  03/27/24 77.1 kg  01/31/24 78 kg  12/23/21 79.4 kg     GEN: Well nourished, well developed in no acute distress CARDIAC: RRR, no murmurs, rubs, gallops RESPIRATORY:  Normal work of breathing MUSCULOSKELETAL:  edema    ASSESSMENT & PLAN:     Atrial fibrillation Diagnosed April  2025 Presented with RVR, still with rapid rates today despite metoprolol We discussed management options.  Given that she is symptomatic and rates are poorly controlled, I advised rhythm control.  I explained the ablation procedure and alternatives including Intraven drugs versus rate control.  Using a shared decision making approach, we decided to proceed with ablation.  We discussed the indication, rationale, logistics, anticipated benefits, and potential risks of the ablation procedure including but not limited to -- bleed at the groin access site, chest pain, damage to nearby  organs such as the diaphragm, lungs, or esophagus, need for a drainage tube, or prolonged hospitalization. I explained that the risk for stroke, heart attack, need for open chest surgery, or even death is very low but not zero. she  expressed understanding and wishes to proceed.   Secondary hypercoagulable state Continue apixaban 5 mg twice daily  Dyslipidemia Continue rosuvastatin 10 mg      Signed, Zayne Draheim E Ocean Kearley, MD  03/27/2024 9:21 AM    [facility]

## 2024-03-27 NOTE — Progress Notes (Signed)
 Discharge instructions reviewed with patient and husband at bedside. PT ambulated to the bathroom, voided without difficulty. Incisions remain C/D/I no s/s of complications. PT escorted from unit to personal vehicle via wheelchair.

## 2024-03-28 ENCOUNTER — Encounter (HOSPITAL_COMMUNITY): Payer: Self-pay | Admitting: Cardiovascular Disease

## 2024-03-28 ENCOUNTER — Telehealth (HOSPITAL_COMMUNITY): Payer: Self-pay

## 2024-03-28 NOTE — Telephone Encounter (Signed)
 Spoke with patient to complete post procedure follow up call.  Patient reports no complications with groin sites.   Instructions reviewed with patient:  Remove large bandage at puncture site after 24 hours. It is normal to have bruising, tenderness, mild swelling, and a pea or marble sized lump/knot at the groin site which can take up to three months to resolve.  Get help right away if you notice sudden swelling at the puncture site.  Check your puncture site every day for signs of infection: fever, redness, swelling, pus drainage, warmth, foul odor or excessive pain. If this occurs, please call the office at [phone], to speak with the nurse. Get help right away if your puncture site is bleeding and the bleeding does not stop after applying firm pressure to the area.  You may continue to have skipped beats/ atrial fibrillation during the first several months after your procedure.  It is very important not to miss any doses of your blood thinner Eliquis.   You will follow up with the Afib clinic on 04/24/24 and follow up with Dr. Nancey on 06/28/24.   Patient verbalized understanding to all instructions provided.

## 2024-03-30 MED FILL — Atropine Sulfate Soln Prefill Syr 1 MG/10ML (0.1 MG/ML): INTRAMUSCULAR | Qty: 10 | Status: AC

## 2024-03-30 MED FILL — Fentanyl Citrate Preservative Free (PF) Inj 100 MCG/2ML: INTRAMUSCULAR | Qty: 2 | Status: AC

## 2024-04-17 DIAGNOSIS — K080 Exfoliation of teeth due to systemic causes: Secondary | ICD-10-CM | POA: Diagnosis not present

## 2024-04-20 DIAGNOSIS — G4733 Obstructive sleep apnea (adult) (pediatric): Secondary | ICD-10-CM | POA: Diagnosis not present

## 2024-04-24 ENCOUNTER — Ambulatory Visit (HOSPITAL_COMMUNITY)
Admit: 2024-04-24 | Discharge: 2024-04-24 | Disposition: A | Discharge status: Home / Self Care | Attending: Physician Assistant | Admitting: Physician Assistant

## 2024-04-24 VITALS — BP 142/78 | HR 59 | Ht 66.5 in | Wt 173.4 lb

## 2024-04-24 DIAGNOSIS — I4819 Other persistent atrial fibrillation: Secondary | ICD-10-CM | POA: Diagnosis not present

## 2024-04-24 DIAGNOSIS — I4891 Unspecified atrial fibrillation: Secondary | ICD-10-CM | POA: Diagnosis not present

## 2024-04-24 NOTE — Progress Notes (Signed)
 Primary Care Physician: Espinoza, Alejandra, DO Primary Cardiologist: None Electrophysiologist: Eulas FORBES Furbish, MD  Referring Physician: Dr Furbish   Karen Cochran is a 68 y.o. female with a history of HLD, atrial fibrillation who presents for follow up in the [facility].  The patient was initially diagnosed with atrial fibrillation 11/2023 during a routine office visit. She was started on metoprolol for rate control and Eliquis for stroke prevention. She was seen by Dr Furbish and underwent afib ablation on 03/27/24.    Patient presents today for follow up for atrial fibrillation. Patient is in SR today and feeling well. She did have some brief episodes of afib the first week after ablation but these have resolved. She denies chest pain or groin issues. No bleeding issues on anticoagulation.   Today, she denies symptoms of palpitations, chest pain, shortness of breath, orthopnea, PND, lower extremity edema, dizziness, presyncope, syncope, snoring, daytime somnolence, bleeding, or neurologic sequela. The patient is tolerating medications without difficulties and is otherwise without complaint today.    Atrial Fibrillation Risk Factors:  she does not have symptoms or diagnosis of sleep apnea. she does not have a history of rheumatic fever.   Atrial Fibrillation Management history:  Previous antiarrhythmic drugs: none Previous cardioversions: 02/04/24 Previous ablations: 03/27/24 Anticoagulation history: Eliquis  ROS- All systems are reviewed and negative except as per the HPI above.  Past Medical History:  Diagnosis Date   Diverticulosis    Hx of adenomatous polyp of colon 01/01/2022   Hyperlipidemia    BORDERLINE-- NO MEDS   Hypertension    BORDERLINE-- NO MEDS    Current Outpatient Medications  Medication Sig Dispense Refill   acetaminophen  (TYLENOL ) 500 MG tablet Take 1,000 mg by mouth every 6 (six) hours as needed for moderate pain (pain score  4-6). (Patient taking differently: Take 1,000 mg by mouth as needed for moderate pain (pain score 4-6).)     Cholecalciferol (VITAMIN D) 50 MCG (2000 UT) CAPS Take 2,000 Units by mouth daily.     ELIQUIS 5 MG TABS tablet Take 5 mg by mouth 2 (two) times daily.     magnesium gluconate (MAGONATE) 500 (27 Mg) MG TABS tablet Take 500 mg by mouth every evening.     metoprolol tartrate (LOPRESSOR) 25 MG tablet Take 25 mg by mouth 2 (two) times daily.     Multiple Vitamin (MULTI VITAMIN) TABS Take 1 tablet by mouth daily.     rosuvastatin (CRESTOR) 10 MG tablet Take 10 mg by mouth daily.     No current facility-administered medications for this encounter.    Physical Exam: BP (!) 142/78   Pulse (!) 59   Ht 5' 6.5 (1.689 m)   Wt 78.7 kg   BMI 27.57 kg/m   GEN: Well nourished, well developed in no acute distress CARDIAC: Regular rate and rhythm, no murmurs, rubs, gallops RESPIRATORY:  Clear to auscultation without rales, wheezing or rhonchi  ABDOMEN: Soft, non-tender, non-distended EXTREMITIES:  No edema; No deformity   Wt Readings from Last 3 Encounters:  04/24/24 78.7 kg  03/27/24 77.1 kg  01/31/24 78 kg     EKG today demonstrates  SB Vent. rate 59 BPM PR interval 162 ms QRS duration 82 ms QT/QTcB 440/435 ms    CHA2DS2-VASc Score = 2  The patient's score is based upon: CHF History: 0 HTN History: 0 Diabetes History: 0 Stroke History: 0 Vascular Disease History: 0 Age Score: 1 Gender Score: 1  ASSESSMENT AND PLAN: Persistent Atrial Fibrillation (ICD10:  I48.19) The patient's CHA2DS2-VASc score is 2, indicating a 2.2% annual risk of stroke.   S/p afib ablation 03/27/24 Patient appears to be maintaining SR Continue Eliquis 5 mg BID with no missed doses for 3 months post ablation. Continue Lopressor 25 mg BID   Follow up with Dr Nancey as scheduled.      [facility] [address] [place], [place] [address] [phone]

## 2024-06-12 DIAGNOSIS — H5203 Hypermetropia, bilateral: Secondary | ICD-10-CM | POA: Diagnosis not present

## 2024-06-28 ENCOUNTER — Encounter: Payer: Self-pay | Admitting: Cardiovascular Disease

## 2024-06-28 ENCOUNTER — Ambulatory Visit: Attending: Cardiovascular Disease | Admitting: Cardiovascular Disease

## 2024-06-28 ENCOUNTER — Ambulatory Visit: Admitting: Cardiovascular Disease

## 2024-06-28 VITALS — BP 158/78 | HR 52 | Resp 16 | Ht 66.0 in | Wt 171.3 lb

## 2024-06-28 DIAGNOSIS — I4891 Unspecified atrial fibrillation: Secondary | ICD-10-CM

## 2024-06-28 NOTE — Patient Instructions (Signed)
 Medication Instructions:  Your physician has recommended you make the following change in your medication:   ** Stop Eliquis  *If you need a refill on your cardiac medications before your next appointment, please call your pharmacy*  Lab Work: None ordered.  If you have labs (blood work) drawn today and your tests are completely normal, you will receive your results only by: MyChart Message (if you have MyChart) OR A paper copy in the mail If you have any lab test that is abnormal or we need to change your treatment, we will call you to review the results.  Testing/Procedures: None ordered.   Follow-Up: At [facility], you and your health needs are our priority.  As part of our continuing mission to provide you with exceptional heart care, our providers are all part of one team.  This team includes your primary Cardiologist (physician) and Advanced Practice Providers or APPs (Physician Assistants and Nurse Practitioners) who all work together to provide you with the care you need, when you need it.  Your next appointment:   6 months Afib Clinic

## 2024-06-28 NOTE — Progress Notes (Signed)
 Electrophysiology Office Note:    Date:  06/28/2024   ID:  Karen Cochran, DOB Jun 13, 1956, MRN [id number]  PCP:  Chet Mad, DO   [facility] Providers Cardiologist:  None Electrophysiologist:  Eulas FORBES Furbish, MD     Referring MD: Chet Mad, DO   History of Present Illness:    Karen Cochran is a 68 y.o. female with a medical history significant for dyslipidemia, referred for management of atrial fibrillation.     I reviewed referral notes by Dr. Athena.  Discussed the use of AI scribe software for clinical note transcription with the patient, who gave verbal consent to proceed.  History of Present Illness Karen Cochran is a 68 year old female with atrial fibrillation who presents for evaluation of her condition and treatment options. She was referred by Dr. Chet for evaluation of atrial fibrillation and RVR.  She was diagnosed with atrial fibrillation and rapid ventricular response (RVR) in April 2025 during a routine office visit. She was started on metoprolol 25 mg twice daily, which led to an improvement in her symptoms.  Eliquis was also started. An echocardiogram performed at that time showed an ejection fraction of 55% and a dilated left atrium.  She experiences symptoms of a fast heart rate, which she initially attributed to anxiety, especially after retiring in October of the previous year. She has noticed palpitations described as a 'flutter' sensation. Her energy levels have decreased, and she experiences fatigue, particularly during physical activities such as walking. She walks for 30 minutes in the mornings and longer on Sunday afternoons, but recently, she has found it difficult to walk up a hill at [facility], needing to stop and catch her breath.  Her husband has a history of atrial fibrillation and has undergone an ablation procedure.  She underwent atrial fibrillation ablation on March 27, 2024.  Since the ablation, she has not  had any symptoms of A-fib recurrence and is doing very well.         Today, she reports that she feels well and has no complaints.  EKGs/Labs/Other Studies Reviewed Today:     Echocardiogram:  TTE --[facility] 12/15/2023 LVEF 50-55% Severely dilated left atrium     EKG:   EKG Interpretation Date/Time:  Wednesday June 28 2024 14:55:31 EST Ventricular Rate:  52 PR Interval:  158 QRS Duration:  84 QT Interval:  420 QTC Calculation: 390 R Axis:   20  Text Interpretation: Sinus bradycardia Cannot rule out Anterior infarct , age undetermined When compared with ECG of 24-Apr-2024 14:58, Minimal criteria for Anterior infarct are now Present Nonspecific T wave abnormality now evident in Inferior leads T wave inversion more evident in Anterior leads Nonspecific T wave abnormality, improved in Lateral leads Confirmed by Furbish Eulas [phone]) on 06/28/2024 3:39:32 PM     Physical Exam:    VS:  BP (!) 158/78 (BP Location: Left Arm, Patient Position: Sitting, Cuff Size: Normal)   Pulse (!) 52   Resp 16   Ht 5' 6 (1.676 m)   Wt 171 lb 4.8 oz (77.7 kg)   SpO2 97%   BMI 27.65 kg/m     Wt Readings from Last 3 Encounters:  06/28/24 171 lb 4.8 oz (77.7 kg)  04/24/24 173 lb 6.4 oz (78.7 kg)  03/27/24 170 lb (77.1 kg)     GEN: Well nourished, well developed in no acute distress CARDIAC: RRR, no murmurs, rubs, gallops RESPIRATORY:  Normal work of breathing MUSCULOSKELETAL:  edema  ASSESSMENT & PLAN:     Atrial fibrillation Diagnosed April 2025 Presented with RVR, still with rapid rates today despite metoprolol Status post atrial fibrillation ablation --maintaining sinus rhythm   Secondary hypercoagulable state CHA2DS2-VASc score is 2, 1 for being female Discontinue apixaban  Dyslipidemia Continue rosuvastatin 10 mg      Signed, Daquarius Dubeau E Jaretzi Droz, MD  06/28/2024 3:44 PM    [facility]

## 2024-07-24 ENCOUNTER — Encounter: Payer: Self-pay | Admitting: Internal Medicine

## 2024-07-24 ENCOUNTER — Ambulatory Visit: Admitting: Internal Medicine

## 2024-07-24 ENCOUNTER — Other Ambulatory Visit (INDEPENDENT_AMBULATORY_CARE_PROVIDER_SITE_OTHER)

## 2024-07-24 VITALS — BP 134/82 | HR 52 | Ht 66.0 in | Wt 169.2 lb

## 2024-07-24 DIAGNOSIS — K769 Liver disease, unspecified: Secondary | ICD-10-CM | POA: Insufficient documentation

## 2024-07-24 LAB — CBC WITH DIFFERENTIAL/PLATELET
Basophils Absolute: 0 K/uL (ref 0.0–0.1)
Basophils Relative: 0.6 % (ref 0.0–3.0)
Eosinophils Absolute: 0.1 K/uL (ref 0.0–0.7)
Eosinophils Relative: 1.6 % (ref 0.0–5.0)
HCT: 41.2 % (ref 36.0–46.0)
Hemoglobin: 13.9 g/dL (ref 12.0–15.0)
Lymphocytes Relative: 34.1 % (ref 12.0–46.0)
Lymphs Abs: 1.3 K/uL (ref 0.7–4.0)
MCHC: 33.6 g/dL (ref 30.0–36.0)
MCV: 91.4 fl (ref 78.0–100.0)
Monocytes Absolute: 0.3 K/uL (ref 0.1–1.0)
Monocytes Relative: 9 % (ref 3.0–12.0)
Neutro Abs: 2.1 K/uL (ref 1.4–7.7)
Neutrophils Relative %: 54.7 % (ref 43.0–77.0)
Platelets: 171 K/uL (ref 150.0–400.0)
RBC: 4.51 Mil/uL (ref 3.87–5.11)
RDW: 13.6 % (ref 11.5–15.5)
WBC: 3.9 K/uL — ABNORMAL LOW (ref 4.0–10.5)

## 2024-07-24 LAB — COMPREHENSIVE METABOLIC PANEL WITH GFR
ALT: 18 U/L (ref 0–35)
AST: 18 U/L (ref 0–37)
Albumin: 4.5 g/dL (ref 3.5–5.2)
Alkaline Phosphatase: 87 U/L (ref 39–117)
BUN: 17 mg/dL (ref 6–23)
CO2: 31 meq/L (ref 19–32)
Calcium: 9.4 mg/dL (ref 8.4–10.5)
Chloride: 102 meq/L (ref 96–112)
Creatinine, Ser: 0.7 mg/dL (ref 0.40–1.20)
GFR: 89.38 mL/min (ref >60.00)
Glucose, Bld: 95 mg/dL (ref 70–99)
Potassium: 4 meq/L (ref 3.5–5.1)
Sodium: 140 meq/L (ref 135–145)
Total Bilirubin: 0.6 mg/dL (ref 0.2–1.2)
Total Protein: 7.2 g/dL (ref 6.0–8.3)

## 2024-07-24 NOTE — Patient Instructions (Addendum)
 Your provider has requested that you go to the basement level for lab work before leaving today. Press B on the elevator. The lab is located at the first door on the left as you exit the elevator.  Due to recent changes in healthcare laws, you may see the results of your imaging and laboratory studies on MyChart before your provider has had a chance to review them.  We understand that in some cases there may be results that are confusing or concerning to you. Not all laboratory results come back in the same time frame and the provider may be waiting for multiple results in order to interpret others.  Please give us  48 hours in order for your provider to thoroughly review all the results before contacting the office for clarification of your results.    [facility]  at [phone] is going to contact you about setting up a MRI liver to be done in March 2026.   You have been scheduled for an MRI at ___________ on ____________. Your appointment time is ________________. Please arrive to admitting (at main entrance of the hospital) 30 minutes prior to your appointment time for registration purposes. Please make certain not to have anything to eat or drink 6 hours prior to your test. In addition, if you have any metal in your body, have a pacemaker or defibrillator, please be sure to let your ordering physician know. This test typically takes 45 minutes to 1 hour to complete. Should you need to reschedule, please call [phone] to do so.   I appreciate the opportunity to care for you. Lupita Commander, MD, [facility]

## 2024-07-24 NOTE — Progress Notes (Signed)
 Karen Cochran 68 y.o. 1956/08/10 [id number]  Assessment & Plan:   Encounter Diagnoses  Name Primary?   Liver lesion, left lobe segment IV and II, 3.5 cm Yes        Liver lesion uncertain significance.  Hepatic adenoma is in the differential per radiology.  Unlikely to be hepatocellular carcinoma but that is in the differential.  Lab testing today with CBC CMET and alpha-fetoprotein.  Repeat MR liver for reassessment, with and without contrast (Eovist) this is scheduled for October 09, 2024.  Lab Results  Component Value Date   WBC 3.9 (L) 07/24/2024   HGB 13.9 07/24/2024   HCT 41.2 07/24/2024   MCV 91.4 07/24/2024   PLT 171.0 07/24/2024     Chemistry      Component Value Date/Time   NA 140 07/24/2024 1036   NA 141 03/07/2024 1052   K 4.0 07/24/2024 1036   CL 102 07/24/2024 1036   CO2 31 07/24/2024 1036   BUN 17 07/24/2024 1036   BUN 18 03/07/2024 1052   CREATININE 0.70 07/24/2024 1036      Component Value Date/Time   CALCIUM 9.4 07/24/2024 1036   ALKPHOS 87 07/24/2024 1036   AST 18 07/24/2024 1036   ALT 18 07/24/2024 1036   BILITOT 0.6 07/24/2024 1036       CC: Chet Mad, DO   Subjective:   Chief Complaint: Liver lesion  HPI 68 year old woman here with her husband, known to me from prior screening colonoscopy in 2023 and 2012.  She had atrial fibrillation ablation and cardiac CT scanning showed a liver lesion prior to that and an MRI was performed, with MRCP.  A 3.5 cm left lobe liver lesion in segment 4 and 2 was found.  She is asymptomatic, rare alcohol.  No family history of liver disease.  No prior history of liver lesions known.  CT had suggested steatosis of the liver also   MRI MRCP with and without contrast 03/26/2024 (images personally viewed by me) IMPRESSION: 1. Well-circumscribed 3.5 cm segment IV/II hepatic lesion may reflect an adenoma but is nonspecific. Suggest correlation with laboratory values including AFP and follow-up MRI  in 6 months with and without Eovist contrast for assessment of stability and potentially more definitive characterization. 2. Cholelithiasis without findings of acute cholecystitis. 3. Colonic diverticulosis  There was no significant hepatic steatosis  Colonoscopy 12/2021-1 diminutive adenoma recall plan for 10 years from then.  Also had some diverticulosis.  Allergies[1] Active Medications[2] Past Medical History:  Diagnosis Date   Atrial fibrillation (HCC)    Diverticulosis    Fatty liver    Hx of adenomatous polyp of colon 01/01/2022   Hyperlipidemia    BORDERLINE-- NO MEDS   Hypertension    BORDERLINE-- NO MEDS   Past Surgical History:  Procedure Laterality Date   ATRIAL FIBRILLATION ABLATION N/A 03/27/2024   Procedure: ATRIAL FIBRILLATION ABLATION;  Surgeon: Nancey Eulas BRAVO, MD;  Location: [facility] INVASIVE CV LAB;  Service: Cardiovascular;  Laterality: N/A;   CARDIOVERSION N/A 02/04/2024   Procedure: CARDIOVERSION;  Surgeon: Kate Lonni CROME, MD;  Location: [facility] INVASIVE CV LAB;  Service: Cardiovascular;  Laterality: N/A;   COLONOSCOPY  03/02/2011   diverticulosis, hemorrhoids   ENDOMETRIAL ABLATION  09/10/2010   TUBAL LIGATION     Social History   Social History Narrative   Married   1 son, 2 granddaughters   Retired from community education officer business   [place] alcohol (beer)   family history includes Dementia in her mother; Heart  failure in her mother and sister; Suicidality in her father.   Review of Systems As per HPI otherwise negative  Objective:   Physical Exam @BP  134/82   Pulse (!) 52   Ht 5' 6 (1.676 m)   Wt 169 lb 4 oz (76.8 kg)   SpO2 100%   BMI 27.32 kg/m @  General:  NAD Eyes:   anicteric Lungs:  clear Heart::  S1S2 no rubs, murmurs or gallops Abdomen:  soft and nontender, BS+ no hepatosplenomegaly or mass Ext:   no edema, cyanosis or clubbing    Data Reviewed:  See HPI    [1] No Known Allergies [2]  Current Meds  Medication Sig    acetaminophen  (TYLENOL ) 500 MG tablet Take 1,000 mg by mouth every 6 (six) hours as needed for moderate pain (pain score 4-6). (Patient taking differently: Take 1,000 mg by mouth as needed for moderate pain (pain score 4-6).)   Cholecalciferol (VITAMIN D) 50 MCG (2000 UT) CAPS Take 2,000 Units by mouth daily.   magnesium gluconate (MAGONATE) 500 (27 Mg) MG TABS tablet Take 500 mg by mouth every evening.   metoprolol tartrate (LOPRESSOR) 25 MG tablet Take 25 mg by mouth 2 (two) times daily.   Multiple Vitamin (MULTI VITAMIN) TABS Take 1 tablet by mouth daily.   rosuvastatin (CRESTOR) 10 MG tablet Take 10 mg by mouth daily.

## 2024-07-26 LAB — AFP TUMOR MARKER: AFP-Tumor Marker: 3.6 ng/mL

## 2024-10-09 ENCOUNTER — Other Ambulatory Visit

## 2024-12-26 ENCOUNTER — Ambulatory Visit (HOSPITAL_COMMUNITY): Admitting: Physician Assistant
